# Patient Record
Sex: Male | Born: 2005
Health system: Southern US, Community
[De-identification: ages and names within clinical notes are randomized; demographics above are authoritative.]

## PROBLEM LIST (undated history)

## (undated) DIAGNOSIS — J020 Streptococcal pharyngitis: Secondary | ICD-10-CM

## (undated) DIAGNOSIS — L700 Acne vulgaris: Secondary | ICD-10-CM

## (undated) DIAGNOSIS — E559 Vitamin D deficiency, unspecified: Secondary | ICD-10-CM

## (undated) DIAGNOSIS — F419 Anxiety disorder, unspecified: Secondary | ICD-10-CM

## (undated) DIAGNOSIS — B2790 Infectious mononucleosis, unspecified without complication: Secondary | ICD-10-CM

## (undated) DIAGNOSIS — F32A Depression, unspecified: Secondary | ICD-10-CM

## (undated) DIAGNOSIS — F329 Major depressive disorder, single episode, unspecified: Secondary | ICD-10-CM

## (undated) DIAGNOSIS — S0093XS Contusion of unspecified part of head, sequela: Secondary | ICD-10-CM

## (undated) DIAGNOSIS — S93601S Unspecified sprain of right foot, sequela: Secondary | ICD-10-CM

## (undated) DIAGNOSIS — F909 Attention-deficit hyperactivity disorder, unspecified type: Secondary | ICD-10-CM

## (undated) DIAGNOSIS — S40012S Contusion of left shoulder, sequela: Secondary | ICD-10-CM

## (undated) DIAGNOSIS — S8392XA Sprain of unspecified site of left knee, initial encounter: Secondary | ICD-10-CM

## (undated) DIAGNOSIS — Z1589 Genetic susceptibility to other disease: Secondary | ICD-10-CM

## (undated) DIAGNOSIS — E7212 Methylenetetrahydrofolate reductase deficiency: Secondary | ICD-10-CM

## (undated) HISTORY — DX: Contusion of unspecified part of head, sequela: S00.93XS

## (undated) HISTORY — DX: Depression, unspecified: F32.A

## (undated) HISTORY — DX: Genetic susceptibility to other disease: Z15.89

## (undated) HISTORY — DX: Unspecified sprain of right foot, sequela: S93.601S

## (undated) HISTORY — PX: HAND SURGERY: SHX662

## (undated) HISTORY — DX: Sprain of unspecified site of left knee, initial encounter: S83.92XA

## (undated) HISTORY — DX: Contusion of left shoulder, sequela: S40.012S

## (undated) HISTORY — DX: Vitamin D deficiency, unspecified: E55.9

## (undated) HISTORY — DX: Anxiety disorder, unspecified: F41.9

## (undated) HISTORY — DX: Attention-deficit hyperactivity disorder, unspecified type: F90.9

## (undated) HISTORY — DX: Acne vulgaris: L70.0

---
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
## 2023-01-19 IMAGING — DX DG ANKLE PORT 2V*R*
3 series · 3 of 3 positions shown · non-contrast
Comparison: None.

CLINICAL DATA: Right ankle injury during wrestling practice

EXAM:
PORTABLE RIGHT ANKLE - 2 VIEW

[ankle ap]
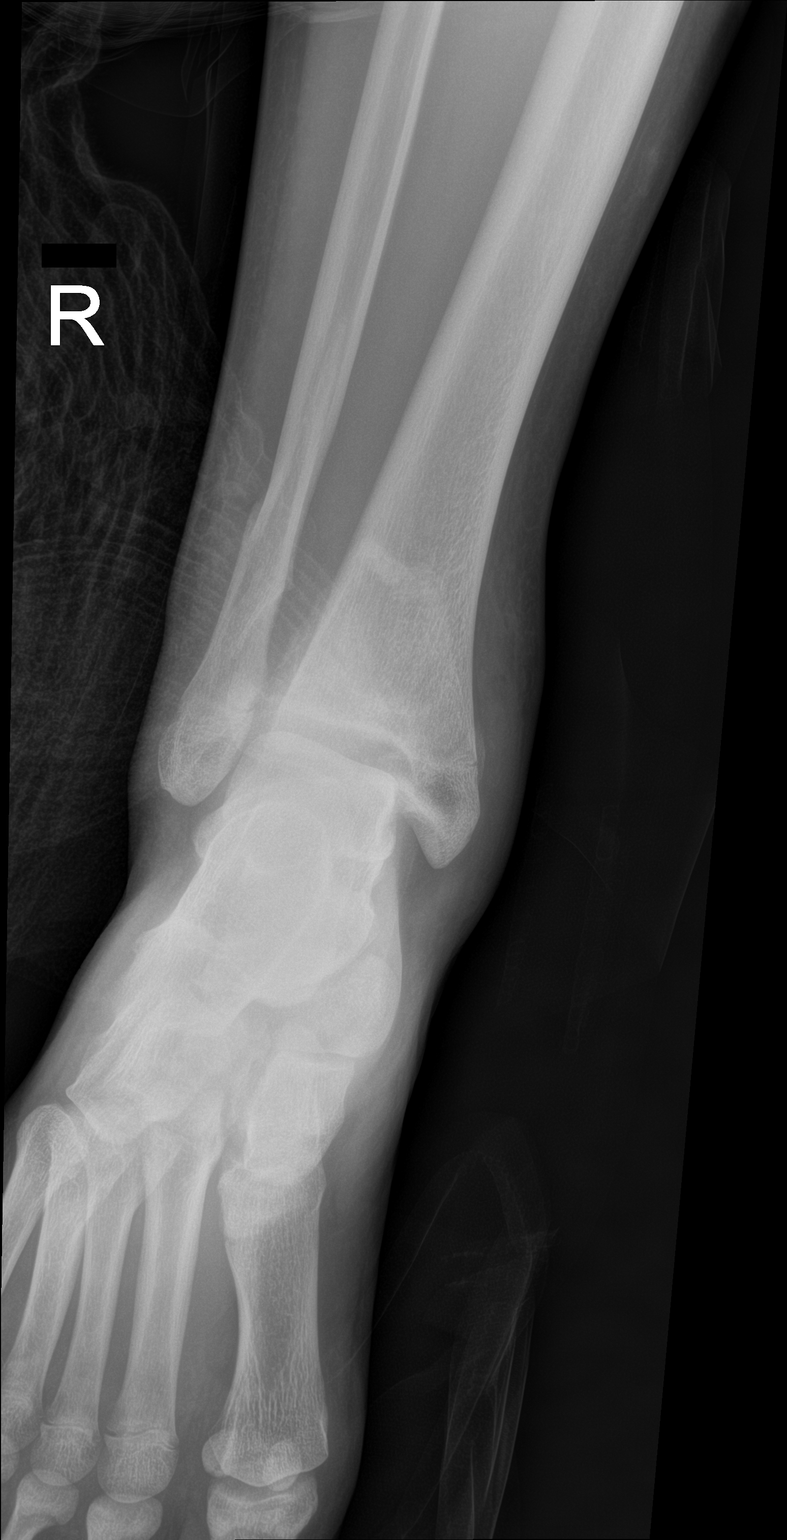

[ankle lat]
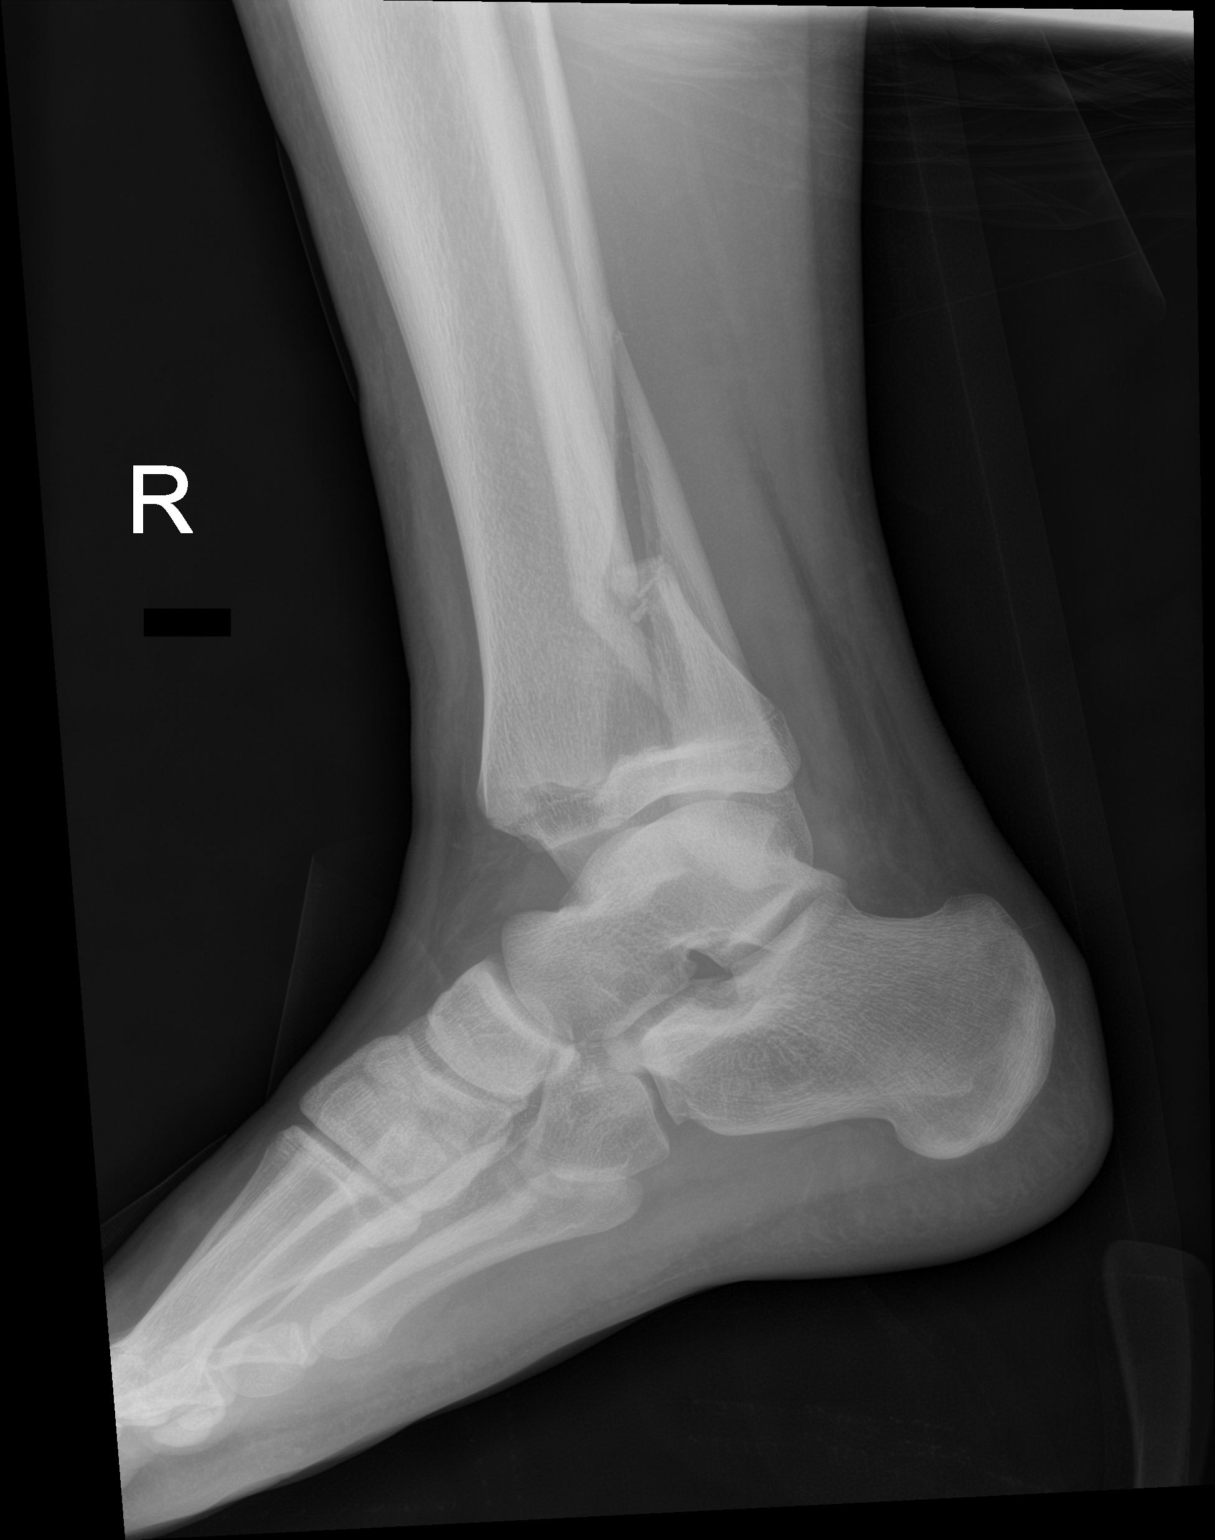

[ankle obl]
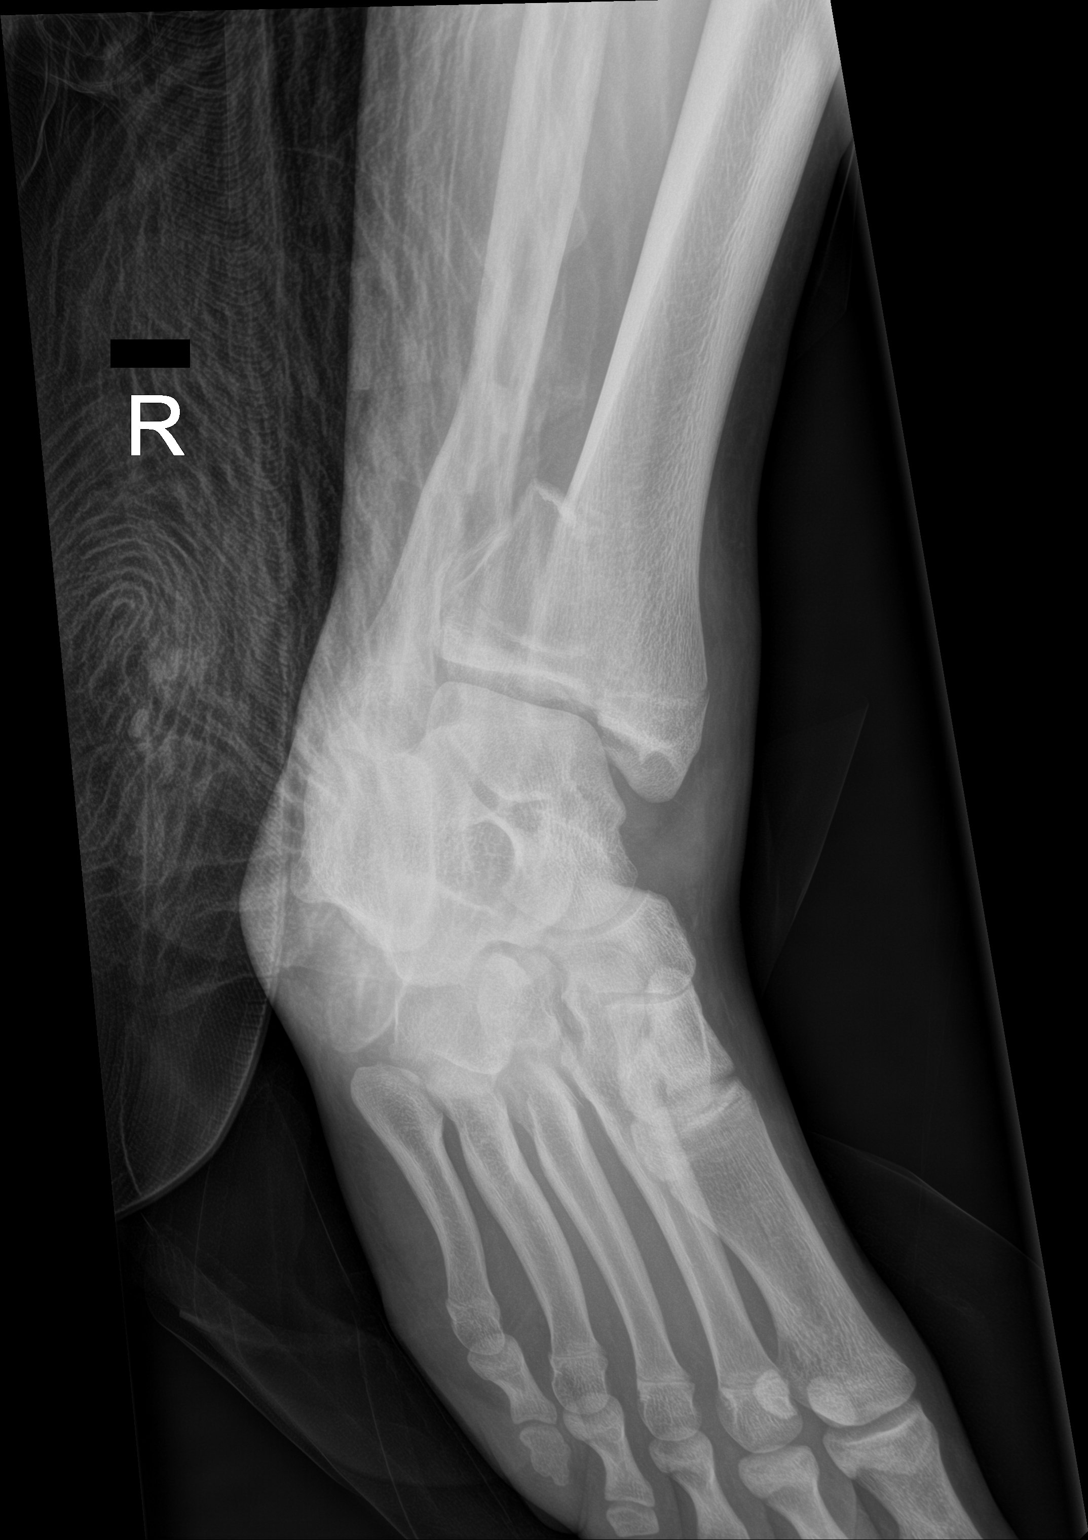

[3 of 3 positions shown; findings below may reference images not displayed]

FINDINGS: Oblique, transsyndesmotic fracture of the distal fibula. Minimally
displaced fracture of the medial malleolus and mildly comminuted,
displaced fracture of the posterior malleolus. Constellation of
findings compatible with a Weber B stage IV injury. Extensive
circumferential swelling of the ankle, most pronounced over the
medial malleolus. Associated ankle joint effusion. No other acute
fracture or traumatic osseous injury.
IMPRESSION: Trimalleolar fracture with a constellation of findings compatible
with a Weber B stage IV unstable ankle injury.

Circumferential swelling and ankle effusion.

## 2023-01-20 IMAGING — RF DG C-ARM 1-60 MIN
1 series · 5 of 5 positions shown · non-contrast
Comparison: 01/24/2021

CLINICAL DATA: ORIF

EXAM:
DG C-ARM 1-60 MIN
FLUOROSCOPY TIME:  Fluoroscopy Time:  42 seconds
Radiation Exposure Index (if provided by the fluoroscopic device):
1.63 mGy
Number of Acquired Spot Images: 5

[Series 1: run · 5 of 5 slices shown]
[im 1/5]
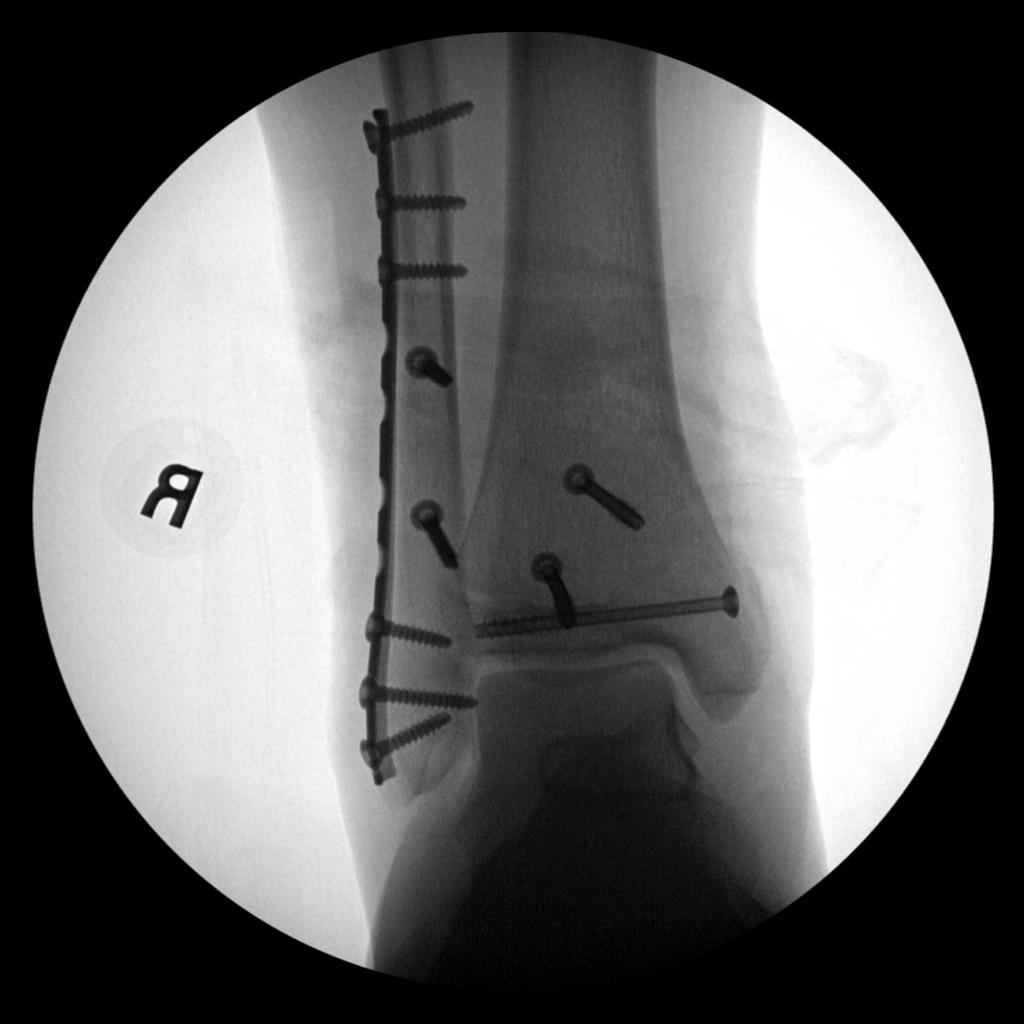
[im 2/5]
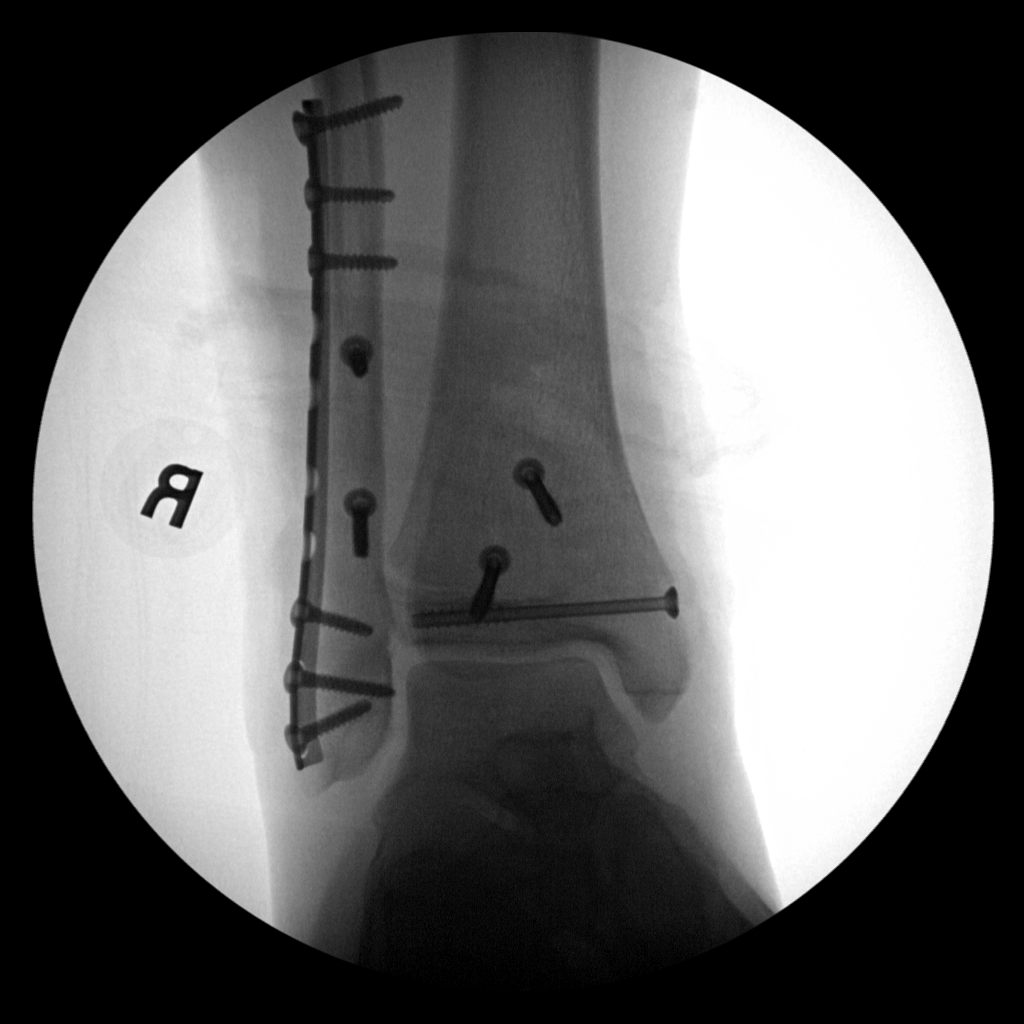
[im 3/5]
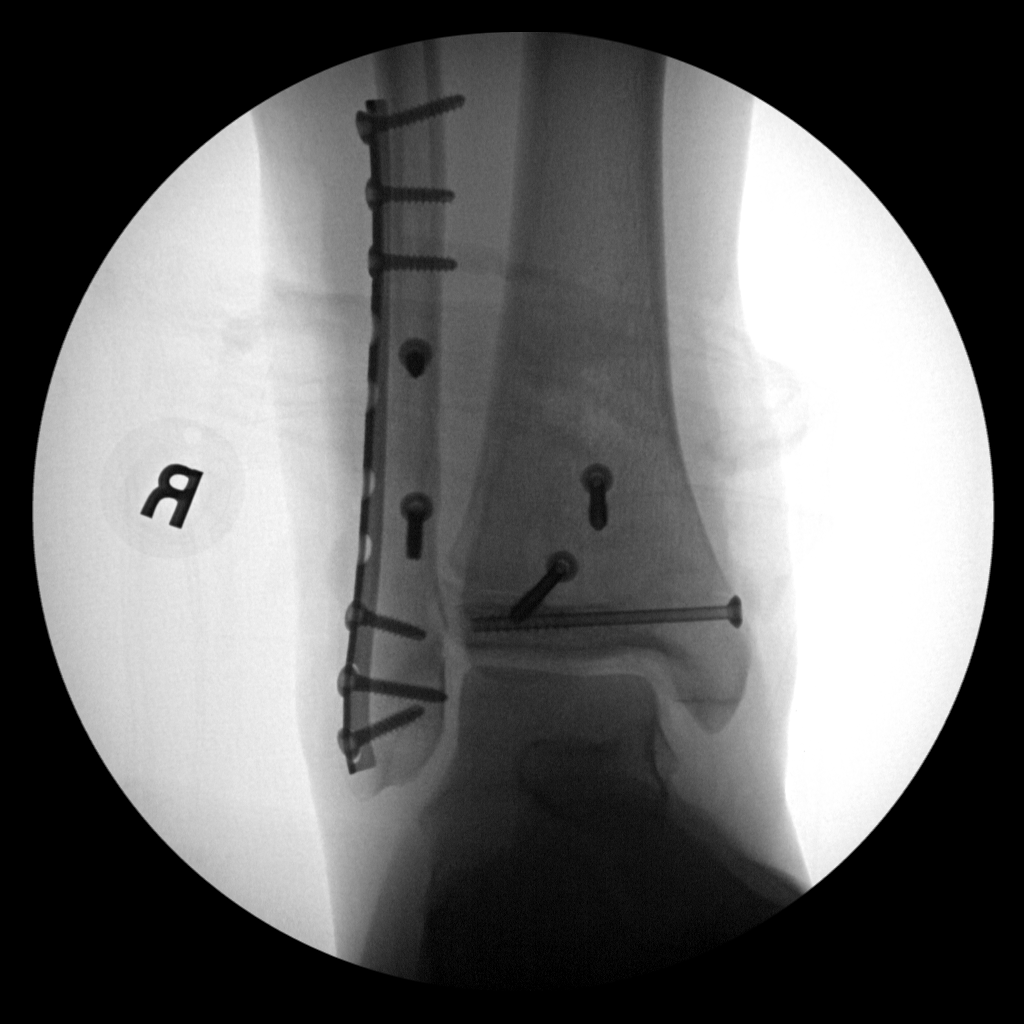
[im 4/5]
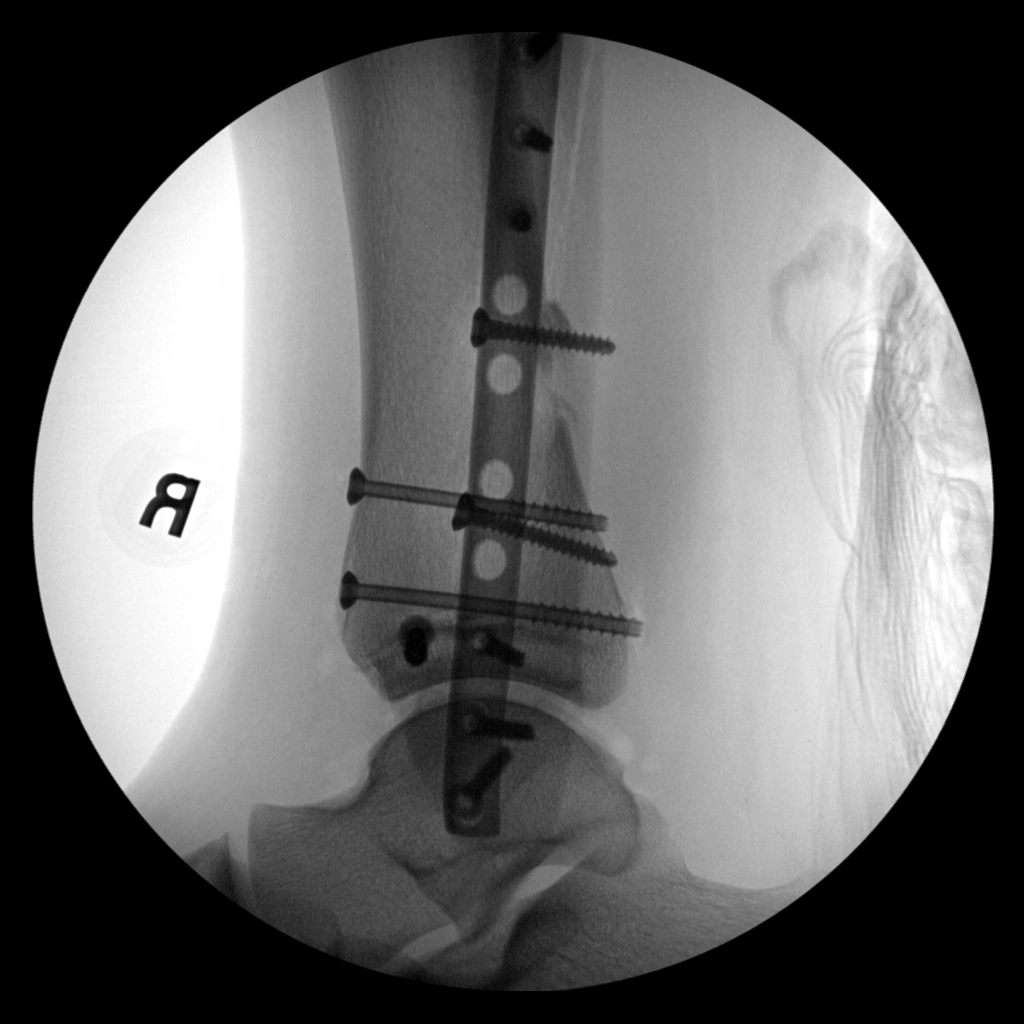
[im 5/5]
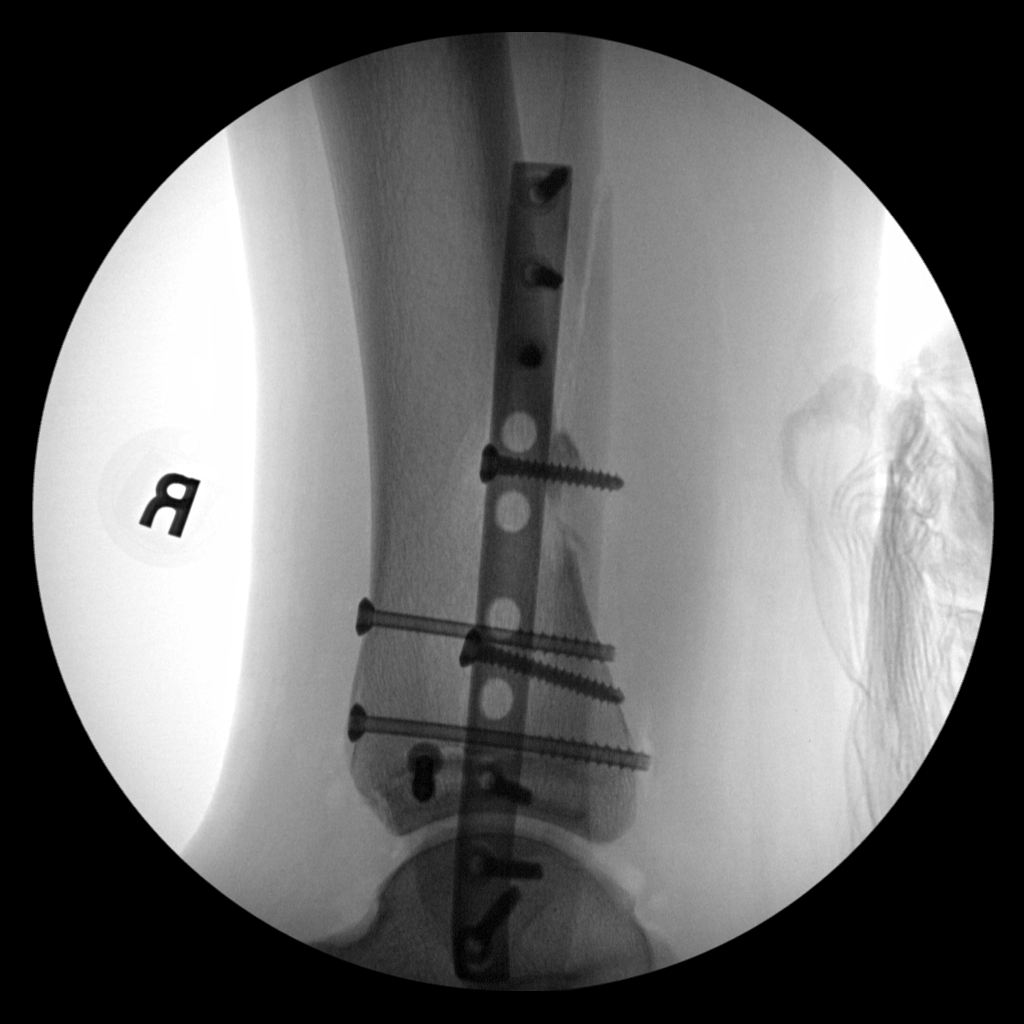

[5 of 5 positions shown; findings below may reference images not displayed]

FINDINGS: Patient has undergone plate screw fixation of the ankle. There are
expected postsurgical changes. The hardware is intact. The alignment
is improved.
IMPRESSION: Expected postsurgical changes of the ankle as above.

## 2023-02-07 ENCOUNTER — Emergency Department (HOSPITAL_COMMUNITY)
Admission: EM | Admit: 2023-02-07 | Discharge: 2023-02-07 | Disposition: A | Discharge status: Home / Self Care | Payer: BC Managed Care – PPO | Attending: Emergency Medicine | Admitting: Emergency Medicine

## 2023-02-07 ENCOUNTER — Other Ambulatory Visit: Payer: Self-pay

## 2023-02-07 DIAGNOSIS — T402X1A Poisoning by other opioids, accidental (unintentional), initial encounter: Secondary | ICD-10-CM | POA: Insufficient documentation

## 2023-02-07 DIAGNOSIS — T40601A Poisoning by unspecified narcotics, accidental (unintentional), initial encounter: Secondary | ICD-10-CM

## 2023-02-07 LAB — ETHANOL: Alcohol, Ethyl (B): <10 mg/dL (ref <10)

## 2023-02-07 LAB — CBG MONITORING, ED: Glucose-Capillary: 130 mg/dL — ABNORMAL HIGH (ref 70–99)

## 2023-02-07 LAB — CBC WITH DIFFERENTIAL/PLATELET
Abs Immature Granulocytes: 0.05 10*3/uL (ref 0.00–0.07)
Basophils Absolute: 0 10*3/uL (ref 0.0–0.1)
Basophils Relative: 0 %
Eosinophils Absolute: 0.1 10*3/uL (ref 0.0–0.5)
Eosinophils Relative: 1 %
HCT: 42.7 % (ref 39.0–52.0)
Hemoglobin: 14.7 g/dL (ref 13.0–17.0)
Immature Granulocytes: 0 %
Lymphocytes Relative: 8 %
Lymphs Abs: 1.2 10*3/uL (ref 0.7–4.0)
MCH: 30.4 pg (ref 26.0–34.0)
MCHC: 34.4 g/dL (ref 30.0–36.0)
MCV: 88.2 fL (ref 80.0–100.0)
Monocytes Absolute: 0.9 10*3/uL (ref 0.1–1.0)
Monocytes Relative: 6 %
Neutro Abs: 11.6 10*3/uL — ABNORMAL HIGH (ref 1.7–7.7)
Neutrophils Relative %: 85 %
Platelets: 243 10*3/uL (ref 150–400)
RBC: 4.84 MIL/uL (ref 4.22–5.81)
RDW: 11.9 % (ref 11.5–15.5)
WBC: 13.9 10*3/uL — ABNORMAL HIGH (ref 4.0–10.5)
nRBC: 0 % (ref 0.0–0.2)

## 2023-02-07 LAB — BASIC METABOLIC PANEL
Anion gap: 8 (ref 5–15)
BUN: 12 mg/dL (ref 6–20)
CO2: 26 mmol/L (ref 22–32)
Calcium: 8.7 mg/dL — ABNORMAL LOW (ref 8.9–10.3)
Chloride: 104 mmol/L (ref 98–111)
Creatinine, Ser: 1.25 mg/dL — ABNORMAL HIGH (ref 0.61–1.24)
GFR, Estimated: >60 mL/min (ref >60)
Glucose, Bld: 125 mg/dL — ABNORMAL HIGH (ref 70–99)
Potassium: 3.6 mmol/L (ref 3.5–5.1)
Sodium: 138 mmol/L (ref 135–145)

## 2023-02-07 LAB — RAPID URINE DRUG SCREEN, HOSP PERFORMED
Amphetamines: NOT DETECTED
Barbiturates: NOT DETECTED
Benzodiazepines: NOT DETECTED
Cocaine: NOT DETECTED
Opiates: NOT DETECTED
Tetrahydrocannabinol: POSITIVE — AB

## 2023-02-07 MED ORDER — ONDANSETRON HCL 4 MG/2ML IJ SOLN
4.0000 mg | Freq: Once | INTRAMUSCULAR | Status: AC
  Administered 2023-02-07: 4 mg via INTRAVENOUS
  Filled 2023-02-07: qty 2

## 2023-02-07 MED ORDER — LACTATED RINGERS IV SOLN: INTRAVENOUS | Status: DC

## 2023-02-07 MED ORDER — LACTATED RINGERS IV BOLUS
1000.0000 mL | Freq: Once | INTRAVENOUS | Status: AC
  Administered 2023-02-07: 1000 mL via INTRAVENOUS

## 2023-02-07 NOTE — ED Notes (Signed)
EDP Anthony [name] informed of Poison controls recommendations. Pt moved into assigned room.

## 2023-02-07 NOTE — ED Provider Notes (Signed)
[place] EMERGENCY DEPARTMENT AT [facility] Provider Note   CSN: [id number] Arrival date & time: 02/07/23  1946     History  Chief Complaint  Patient presents with   Drug Overdose    Anthony [name] is a 17 y.o. male.  17 year old male presents after unintentional overdose of Percocet just prior to arrival.  Patient states that he was not trying to harm himself.  States he took 1 Percocet that he bought from a friend.  Became dizzy and lightheaded.  Was found by his uncle cyanotic.  Department called he was given 2 rounds of Narcan with good response.  Denies any other coingestions at this time.  He has had some nausea and vomiting.  Otherwise feels back to his baseline.  His uncle states that patient has not had any acute psychiatric illnesses as of late.  He has not noted any depression.       Home Medications Prior to Admission medications   Medication Sig Start Date End Date Taking? Authorizing Provider  ARIPiprazole (ABILIFY) 5 MG tablet Take 1 tablet (5 mg total) by mouth at bedtime. Patient taking differently: Take 5 mg by mouth in the morning. 07/15/22   Ambrose Finland, MD  benzonatate (TESSALON) 100 MG capsule Take 1 capsule (100 mg total) by mouth every 8 (eight) hours. [id number]   Delora Fuel, MD  hydrOXYzine (ATARAX) 25 MG tablet Take 1 tablet (25 mg total) by mouth at bedtime as needed and may repeat dose one time if needed for anxiety. 07/15/22   Ambrose Finland, MD      Allergies    Clindamycin/lincomycin    Review of Systems   Review of Systems  All other systems reviewed and are negative.   Physical Exam Updated Vital Signs BP (!) 106/91 (BP Location: Left Arm)   Pulse 72   Temp 97.8 F (36.6 C) (Oral)   Resp 14   SpO2 100%  Physical Exam Vitals and nursing note reviewed.  Constitutional:      General: He is not in acute distress.    Appearance: Normal appearance. He is well-developed. He is not toxic-appearing.   HENT:     Head: Normocephalic and atraumatic.  Eyes:     General: Lids are normal.     Conjunctiva/sclera: Conjunctivae normal.     Pupils: Pupils are equal, round, and reactive to light.  Neck:     Thyroid: No thyroid mass.     Trachea: No tracheal deviation.  Cardiovascular:     Rate and Rhythm: Normal rate and regular rhythm.     Heart sounds: Normal heart sounds. No murmur heard.    No gallop.  Pulmonary:     Effort: Pulmonary effort is normal. No respiratory distress.     Breath sounds: Normal breath sounds. No stridor. No decreased breath sounds, wheezing, rhonchi or rales.  Abdominal:     General: There is no distension.     Palpations: Abdomen is soft.     Tenderness: There is no abdominal tenderness. There is no rebound.  Musculoskeletal:        General: No tenderness. Normal range of motion.     Cervical back: Normal range of motion and neck supple.  Skin:    General: Skin is warm and dry.     Findings: No abrasion or rash.  Neurological:     Mental Status: He is alert and oriented to person, place, and time. Mental status is at baseline.     GCS:  GCS eye subscore is 4. GCS verbal subscore is 5. GCS motor subscore is 6.     Cranial Nerves: No cranial nerve deficit.     Sensory: No sensory deficit.     Motor: Motor function is intact.  Psychiatric:        Attention and Perception: Attention normal.        Speech: Speech normal.        Behavior: Behavior normal.        Thought Content: Thought content does not include suicidal ideation. Thought content does not include suicidal plan.     ED Results / Procedures / Treatments   Labs (all labs ordered are listed, but only abnormal results are displayed) Labs Reviewed  CBG MONITORING, ED - Abnormal; Notable for the following components:      Result Value   Glucose-Capillary 130 (*)    All other components within normal limits  CBC WITH DIFFERENTIAL/PLATELET  RAPID URINE DRUG SCREEN, HOSP PERFORMED  ETHANOL   BASIC METABOLIC PANEL    EKG None  Radiology No results found.  Procedures Procedures    Medications Ordered in ED Medications  lactated ringers bolus 1,000 mL (has no administration in time range)  lactated ringers infusion (has no administration in time range)    ED Course/ Medical Decision Making/ A&P                             Medical Decision Making Amount and/or Complexity of Data Reviewed Labs: ordered.  Risk Prescription drug management.   Patient here after an accidentally ingestion of likely nail.  Received Narcan and was observed for 3 hours here and is back to his baseline.  No concern that this was a suicide attempt.  This was confirmed with his relative at the bedside.  Patient is alert and oriented x 4.  Will be discharged        Final Clinical Impression(s) / ED Diagnoses Final diagnoses:  None    Rx / DC Orders ED Discharge Orders     None         Lacretia Leigh, MD 02/07/23 2238

## 2023-02-07 NOTE — ED Triage Notes (Signed)
Pt presents to ED with Uncle. EMS called d/t overdose around 6 pm. Pt states taking 30 percocets around 5:30 pm. Pt was given Narcan on scene and refused hospital transport. Pt presents here for evaluation c/o headache, nausea, and vomiting. Denies CP/SOB. Pt alert and oriented. Ambulatory independently. Denies SI/HI.

## 2023-04-07 ENCOUNTER — Emergency Department (HOSPITAL_COMMUNITY): Payer: BC Managed Care – PPO

## 2023-04-07 ENCOUNTER — Encounter (HOSPITAL_COMMUNITY): Payer: Self-pay

## 2023-04-07 ENCOUNTER — Emergency Department (HOSPITAL_COMMUNITY)
Admission: EM | Admit: 2023-04-07 | Discharge: 2023-04-07 | Disposition: A | Discharge status: Home / Self Care | Payer: BC Managed Care – PPO | Attending: Emergency Medicine | Admitting: Emergency Medicine

## 2023-04-07 ENCOUNTER — Other Ambulatory Visit: Payer: Self-pay

## 2023-04-07 DIAGNOSIS — B2790 Infectious mononucleosis, unspecified without complication: Secondary | ICD-10-CM | POA: Diagnosis not present

## 2023-04-07 DIAGNOSIS — R112 Nausea with vomiting, unspecified: Secondary | ICD-10-CM | POA: Diagnosis present

## 2023-04-07 DIAGNOSIS — Z1152 Encounter for screening for COVID-19: Secondary | ICD-10-CM | POA: Diagnosis not present

## 2023-04-07 LAB — COMPREHENSIVE METABOLIC PANEL
ALT: 360 U/L — ABNORMAL HIGH (ref 0–44)
AST: 191 U/L — ABNORMAL HIGH (ref 15–41)
Albumin: 3.7 g/dL (ref 3.5–5.0)
Alkaline Phosphatase: 246 U/L — ABNORMAL HIGH (ref 38–126)
Anion gap: 9 (ref 5–15)
BUN: 10 mg/dL (ref 6–20)
CO2: 24 mmol/L (ref 22–32)
Calcium: 8.5 mg/dL — ABNORMAL LOW (ref 8.9–10.3)
Chloride: 102 mmol/L (ref 98–111)
Creatinine, Ser: 1 mg/dL (ref 0.61–1.24)
GFR, Estimated: >60 mL/min (ref >60)
Glucose, Bld: 89 mg/dL (ref 70–99)
Potassium: 3.6 mmol/L (ref 3.5–5.1)
Sodium: 135 mmol/L (ref 135–145)
Total Bilirubin: 1.8 mg/dL — ABNORMAL HIGH (ref 0.3–1.2)
Total Protein: 7.4 g/dL (ref 6.5–8.1)

## 2023-04-07 LAB — SARS CORONAVIRUS 2 BY RT PCR: SARS Coronavirus 2 by RT PCR: NEGATIVE

## 2023-04-07 LAB — CBC WITH DIFFERENTIAL/PLATELET
Abs Immature Granulocytes: 0.04 10*3/uL (ref 0.00–0.07)
Basophils Absolute: 0 10*3/uL (ref 0.0–0.1)
Basophils Relative: 1 %
Eosinophils Absolute: 0 10*3/uL (ref 0.0–0.5)
Eosinophils Relative: 0 %
HCT: 41.9 % (ref 39.0–52.0)
Hemoglobin: 14.6 g/dL (ref 13.0–17.0)
Immature Granulocytes: 1 %
Lymphocytes Relative: 72 %
Lymphs Abs: 5.5 10*3/uL — ABNORMAL HIGH (ref 0.7–4.0)
MCH: 30.3 pg (ref 26.0–34.0)
MCHC: 34.8 g/dL (ref 30.0–36.0)
MCV: 86.9 fL (ref 80.0–100.0)
Monocytes Absolute: 0.4 10*3/uL (ref 0.1–1.0)
Monocytes Relative: 6 %
Neutro Abs: 1.5 10*3/uL — ABNORMAL LOW (ref 1.7–7.7)
Neutrophils Relative %: 20 %
Platelets: 113 10*3/uL — ABNORMAL LOW (ref 150–400)
RBC: 4.82 MIL/uL (ref 4.22–5.81)
RDW: 12.1 % (ref 11.5–15.5)
WBC: 7.5 10*3/uL (ref 4.0–10.5)
nRBC: 0 % (ref 0.0–0.2)

## 2023-04-07 LAB — MONONUCLEOSIS SCREEN: Mono Screen: POSITIVE — AB

## 2023-04-07 LAB — HEPATITIS PANEL, ACUTE
HCV Ab: NONREACTIVE
Hep A IgM: NONREACTIVE
Hep B C IgM: NONREACTIVE
Hepatitis B Surface Ag: NONREACTIVE

## 2023-04-07 LAB — LIPASE, BLOOD: Lipase: 38 U/L (ref 11–51)

## 2023-04-07 MED ORDER — IOHEXOL 300 MG/ML  SOLN
100.0000 mL | Freq: Once | INTRAMUSCULAR | Status: AC | PRN
  Administered 2023-04-07: 100 mL via INTRAVENOUS

## 2023-04-07 MED ORDER — ONDANSETRON HCL 4 MG/2ML IJ SOLN
4.0000 mg | Freq: Once | INTRAMUSCULAR | Status: AC
  Administered 2023-04-07: 4 mg via INTRAVENOUS
  Filled 2023-04-07: qty 2

## 2023-04-07 MED ORDER — LACTATED RINGERS IV BOLUS
1000.0000 mL | Freq: Once | INTRAVENOUS | Status: AC
  Administered 2023-04-07: 1000 mL via INTRAVENOUS

## 2023-04-07 MED ORDER — FENTANYL CITRATE PF 50 MCG/ML IJ SOSY
50.0000 ug | PREFILLED_SYRINGE | Freq: Once | INTRAMUSCULAR | Status: AC
  Administered 2023-04-07: 50 ug via INTRAVENOUS
  Filled 2023-04-07: qty 1

## 2023-04-07 NOTE — ED Provider Notes (Signed)
EMERGENCY DEPARTMENT AT [facility] Provider Note   CSN: [id number] Arrival date & time: 04/07/23  4098     History  Chief Complaint  Patient presents with   Nausea    Anthony [name] is a 17 y.o. male.  HPI   17 year old male with history significant for depression, ADHD, anxiety, presenting to the emergency department with nausea, NBNB vomiting, abdominal pain for the past week.  The patient states that he also endorses a headache.  He denies any sick contacts.  He states that he has had periumbilical abdominal pain with some radiation to the right lower quadrant over the past week.  He was unable to keep anything down stating "I was only able to eat about 3 chicken nuggets yesterday."  He endorses persistent nausea, anorexia and chills. His last BM was yesterday and was normal. He had difficulty sleeping last night due to worsening symptoms and thus presents to the emergency department today for further evaluation.    Home Medications Prior to Admission medications   Medication Sig Start Date End Date Taking? Authorizing Provider  ibuprofen (ADVIL) 400 MG tablet Take 400 mg by mouth in the morning and at bedtime.   Yes [provider]  ARIPiprazole (ABILIFY) 5 MG tablet Take 1 tablet (5 mg total) by mouth at bedtime. Patient taking differently: Take 5 mg by mouth in the morning. 07/15/22   Leata Mouse, MD  benzonatate (TESSALON) 100 MG capsule Take 1 capsule (100 mg total) by mouth every 8 (eight) hours. Patient not taking: Reported on 04/07/2023 09/26/22   Dione Booze, MD  hydrOXYzine (ATARAX) 25 MG tablet Take 1 tablet (25 mg total) by mouth at bedtime as needed and may repeat dose one time if needed for anxiety. Patient not taking: Reported on 04/07/2023 07/15/22   Leata Mouse, MD      Allergies    Clindamycin/lincomycin    Review of Systems   Review of Systems  All other systems reviewed and are  negative.   Physical Exam Updated Vital Signs BP 122/69 (BP Location: Right Arm)   Pulse 70   Temp 98.7 F (37.1 C) (Oral)   Resp 18   Ht 6\' 1"  (1.854 m)   Wt 99.8 kg   SpO2 97%   BMI 29.03 kg/m  Physical Exam Vitals and nursing note reviewed.  Constitutional:      General: He is not in acute distress.    Appearance: He is well-developed.  HENT:     Head: Normocephalic and atraumatic.  Eyes:     Conjunctiva/sclera: Conjunctivae normal.  Cardiovascular:     Rate and Rhythm: Normal rate and regular rhythm.  Pulmonary:     Effort: Pulmonary effort is normal. No respiratory distress.     Breath sounds: Normal breath sounds.  Abdominal:     Palpations: Abdomen is soft.     Tenderness: There is abdominal tenderness in the right lower quadrant and periumbilical area. There is no guarding or rebound.  Musculoskeletal:        General: No swelling.     Cervical back: Neck supple.  Skin:    General: Skin is warm and dry.     Capillary Refill: Capillary refill takes less than 2 seconds.  Neurological:     Mental Status: He is alert.  Psychiatric:        Mood and Affect: Mood normal.     ED Results / Procedures / Treatments   Labs (all labs ordered are  listed, but only abnormal results are displayed) Labs Reviewed  CBC WITH DIFFERENTIAL/PLATELET - Abnormal; Notable for the following components:      Result Value   Platelets 113 (*)    Neutro Abs 1.5 (*)    Lymphs Abs 5.5 (*)    All other components within normal limits  COMPREHENSIVE METABOLIC PANEL - Abnormal; Notable for the following components:   Calcium 8.5 (*)    AST 191 (*)    ALT 360 (*)    Alkaline Phosphatase 246 (*)    Total Bilirubin 1.8 (*)    All other components within normal limits  MONONUCLEOSIS SCREEN - Abnormal; Notable for the following components:   Mono Screen POSITIVE (*)    All other components within normal limits  EPSTEIN-BARR VIRUS VCA, IGM - Abnormal; Notable for the following  components:   EBV VCA IgM >160.0 (*)    All other components within normal limits  SARS CORONAVIRUS 2 BY RT PCR  LIPASE, BLOOD  HEPATITIS PANEL, ACUTE  HIV-1 RNA QUANT-NO REFLEX-BLD  HCV RNA DIAGNOSIS, NAA    EKG None  Radiology US Abdomen Limited RUQ (LIVER/GB)  Result Date: 04/07/2023 CLINICAL DATA:  17 year old male with abnormal LFTs. EXAM: ULTRASOUND ABDOMEN LIMITED RIGHT UPPER QUADRANT COMPARISON:  CT Abdomen and Pelvis 0921 hours today. FINDINGS: Gallbladder: Partially contracted. No gallstones or sludge within the lumen. Wall thickness within normal limits. No sonographic Murphy sign elicited. Common bile duct: Diameter: 4 mm, normal. Liver: No focal lesion identified. Within normal limits in parenchymal echogenicity. Portal vein is patent on color Doppler imaging with normal direction of blood flow towards the liver. Other: Negative visible right kidney. IMPRESSION: Normal right upper quadrant ultrasound. Electronically Signed   By: Odessa Fleming M.D.   On: 04/07/2023 10:29   CT ABDOMEN PELVIS W CONTRAST  Result Date: 04/07/2023 CLINICAL DATA:  Right lower quadrant abdominal pain, headache EXAM: CT ABDOMEN AND PELVIS WITH CONTRAST TECHNIQUE: Multidetector CT imaging of the abdomen and pelvis was performed using the standard protocol following bolus administration of intravenous contrast. RADIATION DOSE REDUCTION: This exam was performed according to the departmental dose-optimization program which includes automated exposure control, adjustment of the mA and/or kV according to patient size and/or use of iterative reconstruction technique. CONTRAST:  OMNIPAQUE IOHEXOL 300 MG/ML  SOLN COMPARISON:  07/23/2022 FINDINGS: Lower chest: Minimal bibasilar atelectasis. Hepatobiliary: Liver is within normal limits. Gallbladder is unremarkable. No intrahepatic or extrahepatic duct dilatation. Pancreas: Within normal limits. Spleen: Within normal limits. Adrenals/Urinary Tract: Adrenal glands are  within normal limits. Kidneys are within normal limits.  No hydronephrosis. Bladder is within normal limits. Stomach/Bowel: Stomach is within normal limits. No evidence of bowel obstruction. Normal appendix (series 2/image 68). No colonic wall thickening or inflammatory changes. Vascular/Lymphatic: No evidence of abdominal aortic aneurysm. No suspicious abdominopelvic lymphadenopathy. Reproductive: Prostate is unremarkable. Other: No abdominopelvic ascites. Musculoskeletal: Visualized osseous structures are within normal limits. IMPRESSION: Negative CT abdomen/pelvis. Normal appendix. Electronically Signed   By: Charline Bills M.D.   On: 04/07/2023 09:36    Procedures Procedures    Medications Ordered in ED Medications  lactated ringers bolus 1,000 mL (0 mLs Intravenous Stopped 04/07/23 0957)  ondansetron (ZOFRAN) injection 4 mg (4 mg Intravenous Given 04/07/23 0807)  fentaNYL (SUBLIMAZE) injection 50 mcg (50 mcg Intravenous Given 04/07/23 0807)  iohexol (OMNIPAQUE) 300 MG/ML solution 100 mL (100 mLs Intravenous Contrast Given 04/07/23 0102)    ED Course/ Medical Decision Making/ A&P Clinical Course as of 04/08/23 2106  Mon Apr 07, 2023  1002 AST(!): 191 [JL]  1002 ALT(!): 360 [JL]  1002 Alkaline Phosphatase(!): 246 [JL]  1002 Total Bilirubin(!): 1.8 [JL]  1148 Mono Screen(!): POSITIVE [JL]    Clinical Course User Index [JL] Ernie Avena, MD                             Medical Decision Making Amount and/or Complexity of Data Reviewed Labs: ordered. Decision-making details documented in ED Course. Radiology: ordered.  Risk Prescription drug management.    17 year old male with history significant for depression, ADHD, anxiety, presenting to the emergency department with nausea, NBNB vomiting, abdominal pain for the past week.  The patient states that he also endorses a headache.  He denies any sick contacts.  He states that he has had periumbilical abdominal pain with some  radiation to the right lower quadrant over the past week.  He was unable to keep anything down stating "I was only able to eat about 3 chicken nuggets yesterday."  He endorses persistent nausea, anorexia and chills. His last BM was yesterday and was normal. He had difficulty sleeping last night due to worsening symptoms and thus presents to the emergency department today for further evaluation.    On arrival, the patient was vitally stable, afebrile, temperature 99.9, not tachycardic heart rate 86, BP 138/74, saturating 98% on room air.  Patient presenting with nausea, NBNB emesis, abdominal pain for the past week.  He has had a headache.  No sore throat.  No sick contacts.  Decreased oral intake.  Passing gas and moving his bowels.  On exam, the patient had generalized abdominal tenderness and some tenderness in the right lower quadrant.  Differential diagnosis includes appendicitis, viral infection, less likely cholelithiasis/cholecystitis, pancreatitis, constipation.  Laboratory evaluation significant for CBC without a leukocytosis or anemia, CMP with elevated LFTs with an AST of 9191, ALT of 360, T. bili elevated to 1.8.  The patient was administered an LR bolus, IV fentanyl and IV Zofran for pain control.  His lipase was normal his COVID-19 testing was negative.  Hepatitis panel was collected and resulted negative.  HIV RNA was negative, HCV was also negative.  CT abdomen pelvis was performed revealed the following: No acute abnormalities  Given the patient's elevated LFTs, right upper quadrant ultrasound was performed: Negative  Given the patient's abdominal discomfort, viral syndrome, elevated LFTs, suspect potential mononucleosis.  Monoscreen was positive and Epstein-Barr virus IgM also resulted elevated.  This is consistent with a diagnosis of mononucleosis.  The patient was advised supportive care at home, continue oral rehydration, avoidance of contact sports activities for at least the next  month, follow-up outpatient with a PCP.  Overall tolerating oral intake, symptomatically improved, stable for discharge.  DC Instructions: CT imaging and ultrasound of the liver was reassuring.  You have elevated liver enzymes in the setting of testing positive for infectious mononucleosis.  Mononucleosis can last for up to 4 weeks. Avoid contact sports for 1 month due to the risk of splenic rupture. Recommend supportive care with continued hydration, recommend Motrin for pain control.   Final Clinical Impression(s) / ED Diagnoses Final diagnoses:  Infectious mononucleosis without complication, infectious mononucleosis due to unspecified organism    Rx / DC Orders ED Discharge Orders     None         Ernie Avena, MD 04/08/23 2106

## 2023-04-07 NOTE — ED Triage Notes (Signed)
Pt. Arrive c/o Nausea, vomiting, and abdominal pain x1 week. Patient also states that he has had a headache. He states that he hasn't been around anyone who has been.

## 2023-04-07 NOTE — Discharge Instructions (Addendum)
CT imaging and ultrasound of the liver was reassuring.  You have elevated liver enzymes in the setting of testing positive for infectious mononucleosis.  Mononucleosis can last for up to 4 weeks. Avoid contact sports for 1 month due to the risk of splenic rupture. Recommend supportive care with continued hydration, recommend Motrin for pain control.

## 2023-04-08 LAB — HCV RNA DIAGNOSIS, NAA: HCV RNA, Quantitation: NOT DETECTED IU/mL

## 2023-04-08 LAB — HIV-1 RNA QUANT-NO REFLEX-BLD
HIV 1 RNA Quant: <20 copies/mL
LOG10 HIV-1 RNA: UNDETERMINED log10copy/mL

## 2023-04-08 LAB — EPSTEIN-BARR VIRUS VCA, IGM: EBV VCA IgM: >160 U/mL — ABNORMAL HIGH (ref 0.0–35.9)

## 2023-04-14 ENCOUNTER — Encounter (HOSPITAL_COMMUNITY): Payer: Self-pay

## 2023-04-14 ENCOUNTER — Other Ambulatory Visit: Payer: Self-pay

## 2023-04-14 ENCOUNTER — Emergency Department (HOSPITAL_COMMUNITY)
Admission: EM | Admit: 2023-04-14 | Discharge: 2023-04-14 | Disposition: A | Discharge status: Home / Self Care | Payer: BC Managed Care – PPO | Attending: Emergency Medicine | Admitting: Emergency Medicine

## 2023-04-14 DIAGNOSIS — B2700 Gammaherpesviral mononucleosis without complication: Secondary | ICD-10-CM | POA: Insufficient documentation

## 2023-04-14 DIAGNOSIS — J029 Acute pharyngitis, unspecified: Secondary | ICD-10-CM | POA: Diagnosis present

## 2023-04-14 HISTORY — DX: Infectious mononucleosis, unspecified without complication: B27.90

## 2023-04-14 MED ORDER — KETOROLAC TROMETHAMINE 15 MG/ML IJ SOLN
15.0000 mg | Freq: Once | INTRAMUSCULAR | Status: AC
  Administered 2023-04-14: 15 mg via INTRAMUSCULAR
  Filled 2023-04-14: qty 1

## 2023-04-14 MED ORDER — DEXAMETHASONE 4 MG PO TABS
10.0000 mg | ORAL_TABLET | Freq: Once | ORAL | Status: AC
  Administered 2023-04-14: 10 mg via ORAL
  Filled 2023-04-14: qty 1

## 2023-04-14 NOTE — ED Triage Notes (Signed)
Pt arrives c/o ongoing sore throat, muffled voice, L ear pain and decreased PO intake due to pain with swallowing. Pt also states that he also thinks he passed out while in the tub. He states that he put his phone down and 2 hours had passed without him remembering, but states that the bath water was still warm when he woke up. Denies falling or injury. Took dayquil and tylenol yesterday afternoon without much relief. Diagnosed with mono last week.

## 2023-04-14 NOTE — ED Provider Notes (Signed)
[place] EMERGENCY DEPARTMENT AT [facility] Provider Note   CSN: [id number] Arrival date & time: 04/14/23  0221     History  Chief Complaint  Patient presents with   Sore Throat    Anthony [name] is a 17 y.o. male.  17 yo M with a chief complaints of a sore throat.  This has been going on for the better part of the week.  He was seen here at the onset of his symptoms and was diagnosed with mononucleosis.  He feels like he is not really able to eat and drink due to the throat pain.  Feels like it has been getting worse.  Feeling fatigued as well.   Sore Throat       Home Medications Prior to Admission medications   Medication Sig Start Date End Date Taking? Authorizing Provider  ARIPiprazole (ABILIFY) 5 MG tablet Take 1 tablet (5 mg total) by mouth at bedtime. Patient taking differently: Take 5 mg by mouth in the morning. 07/15/22   Leata Mouse, MD  benzonatate (TESSALON) 100 MG capsule Take 1 capsule (100 mg total) by mouth every 8 (eight) hours. Patient not taking: Reported on 04/07/2023 09/26/22   Dione Booze, MD  hydrOXYzine (ATARAX) 25 MG tablet Take 1 tablet (25 mg total) by mouth at bedtime as needed and may repeat dose one time if needed for anxiety. Patient not taking: Reported on 04/07/2023 07/15/22   Leata Mouse, MD  ibuprofen (ADVIL) 400 MG tablet Take 400 mg by mouth in the morning and at bedtime.    [provider]      Allergies    Clindamycin/lincomycin    Review of Systems   Review of Systems  Physical Exam Updated Vital Signs BP (!) 147/75 (BP Location: Right Arm)   Pulse (!) 103   Temp 99.6 F (37.6 C) (Oral)   Resp 20   Ht 6\' 1"  (1.854 m)   Wt 99.8 kg   SpO2 96%   BMI 29.03 kg/m  Physical Exam Vitals and nursing note reviewed.  Constitutional:      Appearance: He is well-developed.  HENT:     Head: Normocephalic and atraumatic.     Mouth/Throat:     Tonsils: 3+ on the right. 3+ on the  left.     Comments: Bilateral tonsillar swelling with exudates.  Tolerating secretions without difficulty.  Able to rotate his head without pain.  No sublingual swelling. Eyes:     Pupils: Pupils are equal, round, and reactive to light.  Neck:     Vascular: No JVD.  Cardiovascular:     Rate and Rhythm: Normal rate and regular rhythm.     Heart sounds: No murmur heard.    No friction rub. No gallop.  Pulmonary:     Effort: No respiratory distress.     Breath sounds: No wheezing.  Abdominal:     General: There is no distension.     Tenderness: There is no abdominal tenderness. There is no guarding or rebound.  Musculoskeletal:        General: Normal range of motion.     Cervical back: Normal range of motion and neck supple.  Skin:    Coloration: Skin is not pale.     Findings: No rash.  Neurological:     Mental Status: He is alert and oriented to person, place, and time.  Psychiatric:        Behavior: Behavior normal.     ED Results /  Procedures / Treatments   Labs (all labs ordered are listed, but only abnormal results are displayed) Labs Reviewed - No data to display  EKG None  Radiology No results found.  Procedures Procedures    Medications Ordered in ED Medications  dexamethasone (DECADRON) tablet 10 mg (has no administration in time range)  ketorolac (TORADOL) 15 MG/ML injection 15 mg (has no administration in time range)    ED Course/ Medical Decision Making/ A&P                             Medical Decision Making Risk Prescription drug management.   17 yo M with a chief complaints of a sore throat.  The patient was seen recently and diagnosed with mononucleosis.  At that visit he had multiple lab panels as well as CT imaging and an ultrasound.  Patient is back because his sore throat is so bad that he does not want to swallow.  He can swallow and swallows on exam without issue.  He is able to rotate his head without issue as well.  I doubt  peritonsillar abscess or retropharyngeal abscess or epiglottitis.  He is able to phonate without issue.  He is well-appearing and nontoxic.  He apparently fell asleep while he was taking a bath earlier today but is worried that maybe he passed out.  He is mildly tachycardic on arrival which could be due to his fever.  I do not feel he benefit from laboratory evaluation.  Encouraged him to continue to eat and drink as well as he can.  Will give a dose of Decadron here to try and help him with his throat discomfort.  PCP follow-up.  2:56 AM:  I have discussed the diagnosis/risks/treatment options with the patient.  Evaluation and diagnostic testing in the emergency department does not suggest an emergent condition requiring admission or immediate intervention beyond what has been performed at this time.  They will follow up with PCP. We also discussed returning to the ED immediately if new or worsening sx occur. We discussed the sx which are most concerning (e.g., sudden worsening pain, fever, inability to tolerate by mouth) that necessitate immediate return. Medications administered to the patient during their visit and any new prescriptions provided to the patient are listed below.  Medications given during this visit Medications  dexamethasone (DECADRON) tablet 10 mg (has no administration in time range)  ketorolac (TORADOL) 15 MG/ML injection 15 mg (has no administration in time range)     The patient appears reasonably screen and/or stabilized for discharge and I doubt any other medical condition or other [facility] requiring further screening, evaluation, or treatment in the ED at this time prior to discharge.          Final Clinical Impression(s) / ED Diagnoses Final diagnoses:  Gammaherpesviral mononucleosis without complication    Rx / DC Orders ED Discharge Orders     None         Melene Plan, DO 04/14/23 5625

## 2023-04-14 NOTE — Discharge Instructions (Signed)
Take 4 over the counter ibuprofen tablets 3 times a day or 2 over-the-counter naproxen tablets twice a day for pain. Also take tylenol 1000mg (2 extra strength) four times a day.    You have to eat and drink.  That helps lubricate your throat.  If you stop it actually makes things way worse.  If you feel like you are unable then you need to freeze Gatorade and chip it up like a slushy.

## 2023-04-15 ENCOUNTER — Other Ambulatory Visit: Payer: Self-pay

## 2023-04-15 ENCOUNTER — Emergency Department (HOSPITAL_BASED_OUTPATIENT_CLINIC_OR_DEPARTMENT_OTHER)
Admission: EM | Admit: 2023-04-15 | Discharge: 2023-04-15 | Disposition: A | Discharge status: Home / Self Care | Payer: BC Managed Care – PPO | Attending: Emergency Medicine | Admitting: Emergency Medicine

## 2023-04-15 ENCOUNTER — Emergency Department (HOSPITAL_BASED_OUTPATIENT_CLINIC_OR_DEPARTMENT_OTHER): Payer: BC Managed Care – PPO

## 2023-04-15 ENCOUNTER — Other Ambulatory Visit (HOSPITAL_BASED_OUTPATIENT_CLINIC_OR_DEPARTMENT_OTHER): Payer: Self-pay

## 2023-04-15 ENCOUNTER — Encounter (HOSPITAL_BASED_OUTPATIENT_CLINIC_OR_DEPARTMENT_OTHER): Payer: Self-pay | Admitting: Emergency Medicine

## 2023-04-15 DIAGNOSIS — J029 Acute pharyngitis, unspecified: Secondary | ICD-10-CM | POA: Diagnosis present

## 2023-04-15 DIAGNOSIS — J020 Streptococcal pharyngitis: Secondary | ICD-10-CM | POA: Insufficient documentation

## 2023-04-15 DIAGNOSIS — B2799 Infectious mononucleosis, unspecified with other complication: Secondary | ICD-10-CM | POA: Diagnosis not present

## 2023-04-15 DIAGNOSIS — R1012 Left upper quadrant pain: Secondary | ICD-10-CM | POA: Diagnosis not present

## 2023-04-15 LAB — CBC
HCT: 38.5 % — ABNORMAL LOW (ref 39.0–52.0)
Hemoglobin: 13.2 g/dL (ref 13.0–17.0)
MCH: 30 pg (ref 26.0–34.0)
MCHC: 34.3 g/dL (ref 30.0–36.0)
MCV: 87.5 fL (ref 80.0–100.0)
Platelets: 210 10*3/uL (ref 150–400)
RBC: 4.4 MIL/uL (ref 4.22–5.81)
RDW: 13.1 % (ref 11.5–15.5)
WBC: 9.3 10*3/uL (ref 4.0–10.5)
nRBC: 0 % (ref 0.0–0.2)

## 2023-04-15 LAB — COMPREHENSIVE METABOLIC PANEL
ALT: 262 U/L — ABNORMAL HIGH (ref 0–44)
AST: 118 U/L — ABNORMAL HIGH (ref 15–41)
Albumin: 3.8 g/dL (ref 3.5–5.0)
Alkaline Phosphatase: 216 U/L — ABNORMAL HIGH (ref 38–126)
Anion gap: 8 (ref 5–15)
BUN: 10 mg/dL (ref 6–20)
CO2: 25 mmol/L (ref 22–32)
Calcium: 8.6 mg/dL — ABNORMAL LOW (ref 8.9–10.3)
Chloride: 103 mmol/L (ref 98–111)
Creatinine, Ser: 0.93 mg/dL (ref 0.61–1.24)
GFR, Estimated: >60 mL/min (ref >60)
Glucose, Bld: 93 mg/dL (ref 70–99)
Potassium: 4.2 mmol/L (ref 3.5–5.1)
Sodium: 136 mmol/L (ref 135–145)
Total Bilirubin: 0.7 mg/dL (ref 0.3–1.2)
Total Protein: 7.3 g/dL (ref 6.5–8.1)

## 2023-04-15 LAB — GROUP A STREP BY PCR: Group A Strep by PCR: DETECTED — AB

## 2023-04-15 MED ORDER — SODIUM CHLORIDE 0.9 % IV BOLUS (SEPSIS)
1000.0000 mL | Freq: Once | INTRAVENOUS | Status: AC
  Administered 2023-04-15: 1000 mL via INTRAVENOUS

## 2023-04-15 MED ORDER — KETOROLAC TROMETHAMINE 30 MG/ML IJ SOLN
30.0000 mg | Freq: Once | INTRAMUSCULAR | Status: AC
  Administered 2023-04-15: 30 mg via INTRAVENOUS
  Filled 2023-04-15: qty 1

## 2023-04-15 MED ORDER — SODIUM CHLORIDE 0.9 % IV SOLN: 1000.0000 mL | INTRAVENOUS | Status: DC

## 2023-04-15 MED ORDER — DEXAMETHASONE SODIUM PHOSPHATE 10 MG/ML IJ SOLN
10.0000 mg | Freq: Once | INTRAMUSCULAR | Status: AC
  Administered 2023-04-15: 10 mg via INTRAVENOUS
  Filled 2023-04-15: qty 1

## 2023-04-15 MED ORDER — PENICILLIN G BENZATHINE 1200000 UNIT/2ML IM SUSY
1.2000 10*6.[IU] | PREFILLED_SYRINGE | Freq: Once | INTRAMUSCULAR | Status: AC
  Administered 2023-04-15: 1.2 10*6.[IU] via INTRAMUSCULAR
  Filled 2023-04-15: qty 2

## 2023-04-15 NOTE — ED Provider Notes (Signed)
[place] EMERGENCY DEPARTMENT AT [facility] Provider Note   CSN: [id number] Arrival date & time: 04/15/23  1125     History  Chief Complaint  Patient presents with   Sore Throat    Anthony [name] is a 17 y.o. male.   Sore Throat     Patient has a history of ADHD, anxiety depression who presents to the ED for evaluation of persistent abdominal pain and sore throat.  Patient was initially seen in the emergency room on May 20.  Here was primarily here for abdominal pain at that time.  Patient had laboratory tests and CT scans.  His CT imaging was negative and ultimately had a test that was positive for mono.  Patient return to the emergency room yesterday because of persistent pain in his throat.  He is having diffuse body aches.  It is hard for him to eat or drink.  He spit up some blood recently.  Patient states he is still hurting.  He came tach to the ED because of the persistent symptoms  Home Medications Prior to Admission medications   Medication Sig Start Date End Date Taking? Authorizing Provider  ARIPiprazole (ABILIFY) 5 MG tablet Take 1 tablet (5 mg total) by mouth at bedtime. Patient taking differently: Take 5 mg by mouth in the morning. 07/15/22   Leata Mouse, MD  benzonatate (TESSALON) 100 MG capsule Take 1 capsule (100 mg total) by mouth every 8 (eight) hours. Patient not taking: Reported on 04/07/2023 09/26/22   Dione Booze, MD  hydrOXYzine (ATARAX) 25 MG tablet Take 1 tablet (25 mg total) by mouth at bedtime as needed and may repeat dose one time if needed for anxiety. Patient not taking: Reported on 04/07/2023 07/15/22   Leata Mouse, MD  ibuprofen (ADVIL) 400 MG tablet Take 400 mg by mouth in the morning and at bedtime.    [provider]      Allergies    Clindamycin/lincomycin    Review of Systems   Review of Systems  Physical Exam Updated Vital Signs BP 124/69   Pulse 92   Temp 99.4 F (37.4 C) (Oral)    Resp 20   SpO2 96%  Physical Exam Vitals and nursing note reviewed.  Constitutional:      General: He is not in acute distress.    Appearance: He is well-developed.  HENT:     Head: Normocephalic and atraumatic.     Right Ear: External ear normal.     Left Ear: External ear normal.     Mouth/Throat:     Pharynx: Pharyngeal swelling, oropharyngeal exudate and posterior oropharyngeal erythema present.  Eyes:     General: No scleral icterus.       Right eye: No discharge.        Left eye: No discharge.     Conjunctiva/sclera: Conjunctivae normal.  Neck:     Trachea: No tracheal deviation.  Cardiovascular:     Rate and Rhythm: Normal rate and regular rhythm.  Pulmonary:     Effort: Pulmonary effort is normal. No respiratory distress.     Breath sounds: Normal breath sounds. No stridor. No wheezing or rales.  Abdominal:     General: Bowel sounds are normal. There is no distension.     Palpations: Abdomen is soft.     Tenderness: There is abdominal tenderness in the left upper quadrant. There is no guarding or rebound.  Musculoskeletal:        General: No tenderness or deformity.  Cervical back: Neck supple.  Skin:    General: Skin is warm and dry.     Findings: No rash.  Neurological:     General: No focal deficit present.     Mental Status: He is alert.     Cranial Nerves: No cranial nerve deficit, dysarthria or facial asymmetry.     Sensory: No sensory deficit.     Motor: No abnormal muscle tone or seizure activity.     Coordination: Coordination normal.  Psychiatric:        Mood and Affect: Mood normal.     ED Results / Procedures / Treatments   Labs (all labs ordered are listed, but only abnormal results are displayed) Labs Reviewed  GROUP A STREP BY PCR - Abnormal; Notable for the following components:      Result Value   Group A Strep by PCR DETECTED (*)    All other components within normal limits  CBC - Abnormal; Notable for the following components:    HCT 38.5 (*)    All other components within normal limits  COMPREHENSIVE METABOLIC PANEL - Abnormal; Notable for the following components:   Calcium 8.6 (*)    AST 118 (*)    ALT 262 (*)    Alkaline Phosphatase 216 (*)    All other components within normal limits    EKG None  Radiology US Abdomen Complete  Result Date: 04/15/2023 CLINICAL DATA:  Mononucleosis Increasing abdominal pain for 1.5 weeks EXAM: ABDOMEN ULTRASOUND COMPLETE COMPARISON:  CT abdomen pelvis 04/07/2023 FINDINGS: Gallbladder: No gallstones or wall thickening visualized. No sonographic Murphy sign noted by sonographer. Common bile duct: Diameter: 3 mm Liver: No focal lesion identified. Within normal limits in parenchymal echogenicity. Portal vein is patent on color Doppler imaging with normal direction of blood flow towards the liver. IVC: No abnormality visualized. Pancreas: Visualized portion unremarkable. Spleen: Spleen is enlarged measuring up to 19.2 cm with a volume of 1225 mL. Right Kidney: Length: 11.1 cm. Echogenicity within normal limits. No mass or hydronephrosis visualized. Left Kidney: Length: 12.4 cm. Echogenicity within normal limits. No mass or hydronephrosis visualized. Abdominal aorta: No aneurysm visualized. Other findings: None. IMPRESSION: 1. No acute abnormality of the abdomen. 2. Splenomegaly, which appears similar to CT from 04/07/2023. Electronically Signed   By: Acquanetta Belling M.D.   On: 04/15/2023 13:23    Procedures Procedures    Medications Ordered in ED Medications  sodium chloride 0.9 % bolus 1,000 mL (1,000 mLs Intravenous New Bag/Given 04/15/23 1202)    Followed by  0.9 %  sodium chloride infusion (has no administration in time range)  penicillin g benzathine (BICILLIN LA) 1200000 UNIT/2ML injection 1.2 Million Units (has no administration in time range)  ketorolac (TORADOL) 30 MG/ML injection 30 mg (30 mg Intravenous Given 04/15/23 1207)  dexamethasone (DECADRON) injection 10 mg (10 mg  Intravenous Given 04/15/23 1207)    ED Course/ Medical Decision Making/ A&P Clinical Course as of 04/15/23 1351  Tue Apr 15, 2023  1326 Labs reviewed.  CBC normal.  Metabolic panel does showElevated LFTs [JK]  1327 LFTs are decreasing compared to previous values.  CBC normal. [JK]  1327 Strep test is positive. [JK]  1327 CT does show splenomegaly but unchanged [JK]    Clinical Course User Index [JK] Linwood Dibbles, MD                             Medical Decision Making Problems Addressed:  Infectious mononucleosis, with other complication, infectious mononucleosis due to unspecified organism: acute illness or injury that poses a threat to life or bodily functions Strep throat: acute illness or injury that poses a threat to life or bodily functions  Amount and/or Complexity of Data Reviewed Labs: ordered. Decision-making details documented in ED Course. Radiology: ordered.  Risk Prescription drug management.   Patient presented to ED for evaluation of persistent sore throat.  Patient was previously diagnosed with mono.  He also has been having abdominal pain and was noted to have splenomegaly.  Ultrasound was repeated to rule out any splenic rupture or significant enlargement.  Ultrasound did not show any acute changes compared to recent imaging.  He does have a component splenomegaly related to his mono.  Patient's LFTs are slightly improving.  Patient strep test was positive.  Appears to unfortunately have both strep and mono.  He was given a dose of penicillin IM.  Patient was treated with IV fluids and steroids with improvement of symptoms.        Final Clinical Impression(s) / ED Diagnoses Final diagnoses:  Strep throat  Infectious mononucleosis, with other complication, infectious mononucleosis due to unspecified organism    Rx / DC Orders ED Discharge Orders     None         Linwood Dibbles, MD 04/15/23 1351

## 2023-04-15 NOTE — ED Notes (Signed)
Pt given discharge instructions. Opportunities given for questions. Pt verbalizes understanding. PIV removed x1. Aishia Barkey R, RN 

## 2023-04-15 NOTE — Discharge Instructions (Addendum)
Your strep test was positive today.  You were given a dose of penicillin in the ED.  Sore throat is a combination of the mono as well as your strep throat.  Try to stay well-hydrated.  Follow-up with a primary care doctor or an ENT doctor to be rechecked

## 2023-04-15 NOTE — ED Triage Notes (Signed)
Pt arrives to ED with c/o severe sore throat x4 days. Was dx with Mono x1.5 weeks ago. Symptoms associated with abdominal pain and dysphagia.

## 2023-04-17 ENCOUNTER — Encounter (HOSPITAL_BASED_OUTPATIENT_CLINIC_OR_DEPARTMENT_OTHER): Payer: Self-pay

## 2023-04-17 ENCOUNTER — Emergency Department (HOSPITAL_BASED_OUTPATIENT_CLINIC_OR_DEPARTMENT_OTHER)
Admission: EM | Admit: 2023-04-17 | Discharge: 2023-04-17 | Disposition: A | Discharge status: Home / Self Care | Payer: BC Managed Care – PPO | Attending: Emergency Medicine | Admitting: Emergency Medicine

## 2023-04-17 ENCOUNTER — Other Ambulatory Visit: Payer: Self-pay

## 2023-04-17 ENCOUNTER — Other Ambulatory Visit (HOSPITAL_BASED_OUTPATIENT_CLINIC_OR_DEPARTMENT_OTHER): Payer: Self-pay

## 2023-04-17 DIAGNOSIS — R0981 Nasal congestion: Secondary | ICD-10-CM | POA: Insufficient documentation

## 2023-04-17 DIAGNOSIS — B2790 Infectious mononucleosis, unspecified without complication: Secondary | ICD-10-CM

## 2023-04-17 DIAGNOSIS — E860 Dehydration: Secondary | ICD-10-CM | POA: Diagnosis not present

## 2023-04-17 DIAGNOSIS — R110 Nausea: Secondary | ICD-10-CM | POA: Diagnosis not present

## 2023-04-17 DIAGNOSIS — J029 Acute pharyngitis, unspecified: Secondary | ICD-10-CM | POA: Insufficient documentation

## 2023-04-17 DIAGNOSIS — E871 Hypo-osmolality and hyponatremia: Secondary | ICD-10-CM | POA: Diagnosis not present

## 2023-04-17 DIAGNOSIS — R634 Abnormal weight loss: Secondary | ICD-10-CM | POA: Insufficient documentation

## 2023-04-17 HISTORY — DX: Streptococcal pharyngitis: J02.0

## 2023-04-17 LAB — BASIC METABOLIC PANEL
Anion gap: 10 (ref 5–15)
BUN: 11 mg/dL (ref 6–20)
CO2: 24 mmol/L (ref 22–32)
Calcium: 9.1 mg/dL (ref 8.9–10.3)
Chloride: 100 mmol/L (ref 98–111)
Creatinine, Ser: 0.98 mg/dL (ref 0.61–1.24)
GFR, Estimated: >60 mL/min (ref >60)
Glucose, Bld: 92 mg/dL (ref 70–99)
Potassium: 4.1 mmol/L (ref 3.5–5.1)
Sodium: 134 mmol/L — ABNORMAL LOW (ref 135–145)

## 2023-04-17 LAB — CBC WITH DIFFERENTIAL/PLATELET
Abs Immature Granulocytes: 0 10*3/uL (ref 0.00–0.07)
Basophils Absolute: 0 10*3/uL (ref 0.0–0.1)
Basophils Relative: 0 %
Eosinophils Absolute: 0 10*3/uL (ref 0.0–0.5)
Eosinophils Relative: 0 %
HCT: 38 % — ABNORMAL LOW (ref 39.0–52.0)
Hemoglobin: 13.3 g/dL (ref 13.0–17.0)
Lymphocytes Relative: 40 %
Lymphs Abs: 4 10*3/uL (ref 0.7–4.0)
MCH: 30.2 pg (ref 26.0–34.0)
MCHC: 35 g/dL (ref 30.0–36.0)
MCV: 86.2 fL (ref 80.0–100.0)
Monocytes Absolute: 1.2 10*3/uL — ABNORMAL HIGH (ref 0.1–1.0)
Monocytes Relative: 12 %
Neutro Abs: 4.8 10*3/uL (ref 1.7–7.7)
Neutrophils Relative %: 48 %
Platelets: 247 10*3/uL (ref 150–400)
RBC: 4.41 MIL/uL (ref 4.22–5.81)
RDW: 12.7 % (ref 11.5–15.5)
WBC: 10 10*3/uL (ref 4.0–10.5)
nRBC: 0 % (ref 0.0–0.2)

## 2023-04-17 LAB — RAPID HIV SCREEN (HIV 1/2 AB+AG)
HIV 1/2 Antibodies: NONREACTIVE
HIV-1 P24 Antigen - HIV24: NONREACTIVE

## 2023-04-17 MED ORDER — SODIUM CHLORIDE 0.9 % IV SOLN
1.0000 g | Freq: Once | INTRAVENOUS | Status: AC
  Administered 2023-04-17: 1 g via INTRAVENOUS
  Filled 2023-04-17: qty 10

## 2023-04-17 MED ORDER — ACETAMINOPHEN 500 MG PO TABS
1000.0000 mg | ORAL_TABLET | Freq: Once | ORAL | Status: AC
  Administered 2023-04-17: 1000 mg via ORAL
  Filled 2023-04-17: qty 2

## 2023-04-17 MED ORDER — SODIUM CHLORIDE 0.9 % IV BOLUS
1000.0000 mL | Freq: Once | INTRAVENOUS | Status: AC
  Administered 2023-04-17: 1000 mL via INTRAVENOUS

## 2023-04-17 MED ORDER — MORPHINE SULFATE (PF) 4 MG/ML IV SOLN
4.0000 mg | Freq: Once | INTRAVENOUS | Status: AC
  Administered 2023-04-17: 4 mg via INTRAVENOUS
  Filled 2023-04-17: qty 1

## 2023-04-17 MED ORDER — DEXAMETHASONE SODIUM PHOSPHATE 10 MG/ML IJ SOLN
10.0000 mg | Freq: Once | INTRAMUSCULAR | Status: AC
  Administered 2023-04-17: 10 mg via INTRAVENOUS
  Filled 2023-04-17: qty 1

## 2023-04-17 MED ORDER — KETOROLAC TROMETHAMINE 15 MG/ML IJ SOLN
15.0000 mg | Freq: Once | INTRAMUSCULAR | Status: AC
  Administered 2023-04-17: 15 mg via INTRAVENOUS
  Filled 2023-04-17: qty 1

## 2023-04-17 NOTE — Discharge Instructions (Addendum)
The steroid dose you were given will last approximately 2 days. Remember no contact sports. Follow-up for recheck on Monday with physician or return to the ER. For breathing difficulty or new concerns return the emergency department. Continue Tylenol every 4 hours and Motrin every 6 hours needed for pain and fever.

## 2023-04-17 NOTE — ED Triage Notes (Signed)
"  Sick x 2 weeks, seen here on 5/20 and had mono, then on 5/27 and 5/28 was treated for strep throat and got shots. Told to take tylenol and motrin, but I can not swallow the pills because my throat is so painful and swollen that I spit the pills back up and I just keep getting more weak" per pt

## 2023-04-17 NOTE — ED Provider Notes (Signed)
[place] EMERGENCY DEPARTMENT AT [facility] Provider Note   CSN: [id number] Arrival date & time: 04/17/23  8295     History  Chief Complaint  Patient presents with   Sore Throat    Anthony [name] is a 17 y.o. male.  Patient presents with persistent sore throat, nausea, difficulty tolerating oral liquid intake due to pain.  Patient was diagnosed with mono on May 20 and given instructions to avoid contact sports and supportive care and then diagnosed with strep throat on May 28 given penicillin shot and additional supportive care.  Patient does not build to swallow pills due to pain and has not tolerated significant oral intake the entire week.  Patient says he is lost weight over the past 10 days.       Home Medications Prior to Admission medications   Medication Sig Start Date End Date Taking? Authorizing Provider  ARIPiprazole (ABILIFY) 5 MG tablet Take 1 tablet (5 mg total) by mouth at bedtime. Patient taking differently: Take 5 mg by mouth in the morning. 07/15/22   Leata Mouse, MD  benzonatate (TESSALON) 100 MG capsule Take 1 capsule (100 mg total) by mouth every 8 (eight) hours. Patient not taking: Reported on 04/07/2023 09/26/22   Dione Booze, MD  hydrOXYzine (ATARAX) 25 MG tablet Take 1 tablet (25 mg total) by mouth at bedtime as needed and may repeat dose one time if needed for anxiety. Patient not taking: Reported on 04/07/2023 07/15/22   Leata Mouse, MD  ibuprofen (ADVIL) 400 MG tablet Take 400 mg by mouth in the morning and at bedtime.    [provider]      Allergies    Clindamycin/lincomycin    Review of Systems   Review of Systems  Constitutional:  Positive for fatigue and unexpected weight change. Negative for chills and fever.  HENT:  Positive for congestion and sore throat.   Eyes:  Negative for visual disturbance.  Respiratory:  Negative for shortness of breath.   Cardiovascular:  Negative for chest pain.   Gastrointestinal:  Negative for abdominal pain and vomiting.  Genitourinary:  Negative for dysuria and flank pain.  Musculoskeletal:  Negative for back pain, neck pain and neck stiffness.  Skin:  Negative for rash.  Neurological:  Negative for light-headedness and headaches.    Physical Exam Updated Vital Signs BP 117/65   Pulse 81   Temp 100 F (37.8 C)   Resp 17   Ht 6\' 1"  (1.854 m)   Wt 97.5 kg   SpO2 97%   BMI 28.37 kg/m  Physical Exam Vitals and nursing note reviewed.  Constitutional:      General: He is not in acute distress.    Appearance: He is well-developed.  HENT:     Head: Normocephalic and atraumatic.     Nose: Congestion present.     Mouth/Throat:     Mouth: Mucous membranes are dry.     Tonsils: Tonsillar exudate present. No tonsillar abscesses. 3+ on the right. 3+ on the left.  Eyes:     General:        Right eye: No discharge.        Left eye: No discharge.     Conjunctiva/sclera: Conjunctivae normal.  Neck:     Trachea: No tracheal deviation.  Cardiovascular:     Rate and Rhythm: Normal rate and regular rhythm.  Pulmonary:     Effort: Pulmonary effort is normal.     Breath sounds: Normal breath sounds.  Abdominal:     General: There is no distension.     Palpations: Abdomen is soft.     Tenderness: There is no abdominal tenderness. There is no guarding.  Musculoskeletal:     Cervical back: Normal range of motion and neck supple. No rigidity.  Skin:    General: Skin is warm.     Capillary Refill: Capillary refill takes less than 2 seconds.     Findings: No rash.  Neurological:     General: No focal deficit present.     Mental Status: He is alert.     Cranial Nerves: No cranial nerve deficit.  Psychiatric:        Mood and Affect: Mood normal.     ED Results / Procedures / Treatments   Labs (all labs ordered are listed, but only abnormal results are displayed) Labs Reviewed  CBC WITH DIFFERENTIAL/PLATELET - Abnormal; Notable for the  following components:      Result Value   HCT 38.0 (*)    Monocytes Absolute 1.2 (*)    All other components within normal limits  BASIC METABOLIC PANEL - Abnormal; Notable for the following components:   Sodium 134 (*)    All other components within normal limits  RAPID HIV SCREEN (HIV 1/2 AB+AG)  GC/CHLAMYDIA PROBE AMP ([place]) NOT AT [facility]    EKG None  Radiology US Abdomen Complete  Result Date: 04/15/2023 CLINICAL DATA:  Mononucleosis Increasing abdominal pain for 1.5 weeks EXAM: ABDOMEN ULTRASOUND COMPLETE COMPARISON:  CT abdomen pelvis 04/07/2023 FINDINGS: Gallbladder: No gallstones or wall thickening visualized. No sonographic Murphy sign noted by sonographer. Common bile duct: Diameter: 3 mm Liver: No focal lesion identified. Within normal limits in parenchymal echogenicity. Portal vein is patent on color Doppler imaging with normal direction of blood flow towards the liver. IVC: No abnormality visualized. Pancreas: Visualized portion unremarkable. Spleen: Spleen is enlarged measuring up to 19.2 cm with a volume of 1225 mL. Right Kidney: Length: 11.1 cm. Echogenicity within normal limits. No mass or hydronephrosis visualized. Left Kidney: Length: 12.4 cm. Echogenicity within normal limits. No mass or hydronephrosis visualized. Abdominal aorta: No aneurysm visualized. Other findings: None. IMPRESSION: 1. No acute abnormality of the abdomen. 2. Splenomegaly, which appears similar to CT from 04/07/2023. Electronically Signed   By: Acquanetta Belling M.D.   On: 04/15/2023 13:23    Procedures Procedures    Medications Ordered in ED Medications  acetaminophen (TYLENOL) tablet 1,000 mg (has no administration in time range)  sodium chloride 0.9 % bolus 1,000 mL (has no administration in time range)  morphine (PF) 4 MG/ML injection 4 mg (has no administration in time range)  sodium chloride 0.9 % bolus 1,000 mL (0 mLs Intravenous Stopped 04/17/23 1057)  dexamethasone (DECADRON) injection 10  mg (10 mg Intravenous Given 04/17/23 1005)  ketorolac (TORADOL) 15 MG/ML injection 15 mg (15 mg Intravenous Given 04/17/23 1005)  cefTRIAXone (ROCEPHIN) 1 g in sodium chloride 0.9 % 100 mL IVPB (0 g Intravenous Stopped 04/17/23 1057)    ED Course/ Medical Decision Making/ A&P                             Medical Decision Making Amount and/or Complexity of Data Reviewed Labs: ordered.  Risk OTC drugs. Prescription drug management.   Patient with recent diagnosis of mono followed by strep presents with persistent sore throat and clinical concern for dehydration pain control.  IV ordered for IV fluid bolus,  general blood work to check electrolytes/kidney function/white blood cell count.  Decadron and Toradol ordered for swelling and pain.  Patient has a fever plan for Tylenol.   Symptoms likely from combination of recent mono and strep, no signs of peritonsillar abscess on exam or deep space infection.  Other differentials include gonococcal as patient sexually active, GC will be sent and Rocephin dose given.   Blood work independently reviewed mild hyponatremia 134, repeat IV fluid bolus given.  Other electrolytes unremarkable with normal white blood cell count normal hemoglobin.  HIV quick test reviewed negative.         Final Clinical Impression(s) / ED Diagnoses Final diagnoses:  Pharyngitis due to infectious mononucleosis  Dehydration  Weight loss    Rx / DC Orders ED Discharge Orders     None         Blane Ohara, MD 04/17/23 1212

## 2023-04-18 LAB — GC/CHLAMYDIA PROBE AMP (~~LOC~~) NOT AT ARMC
Chlamydia: NEGATIVE
Comment: NEGATIVE
Comment: NORMAL
Neisseria Gonorrhea: NEGATIVE

## 2023-07-26 ENCOUNTER — Inpatient Hospital Stay (HOSPITAL_COMMUNITY): Payer: BC Managed Care – PPO

## 2023-07-26 ENCOUNTER — Emergency Department (HOSPITAL_COMMUNITY): Payer: BC Managed Care – PPO

## 2023-07-26 ENCOUNTER — Encounter (HOSPITAL_COMMUNITY): Payer: Self-pay

## 2023-07-26 ENCOUNTER — Other Ambulatory Visit: Payer: Self-pay

## 2023-07-26 ENCOUNTER — Observation Stay (HOSPITAL_COMMUNITY)
Admission: EM | Admit: 2023-07-26 | Discharge: 2023-07-27 | Disposition: A | Discharge status: Home / Self Care | Payer: BC Managed Care – PPO | Attending: General Surgery | Admitting: General Surgery

## 2023-07-26 DIAGNOSIS — S301XXA Contusion of abdominal wall, initial encounter: Secondary | ICD-10-CM | POA: Diagnosis present

## 2023-07-26 DIAGNOSIS — S62234A Other nondisplaced fracture of base of first metacarpal bone, right hand, initial encounter for closed fracture: Secondary | ICD-10-CM | POA: Insufficient documentation

## 2023-07-26 DIAGNOSIS — R531 Weakness: Secondary | ICD-10-CM | POA: Diagnosis present

## 2023-07-26 DIAGNOSIS — M25571 Pain in right ankle and joints of right foot: Secondary | ICD-10-CM | POA: Diagnosis present

## 2023-07-26 DIAGNOSIS — S0001XA Abrasion of scalp, initial encounter: Secondary | ICD-10-CM | POA: Diagnosis present

## 2023-07-26 DIAGNOSIS — Z79899 Other long term (current) drug therapy: Secondary | ICD-10-CM

## 2023-07-26 DIAGNOSIS — Y939 Activity, unspecified: Secondary | ICD-10-CM | POA: Insufficient documentation

## 2023-07-26 DIAGNOSIS — Z818 Family history of other mental and behavioral disorders: Secondary | ICD-10-CM

## 2023-07-26 DIAGNOSIS — Y999 Unspecified external cause status: Secondary | ICD-10-CM | POA: Insufficient documentation

## 2023-07-26 DIAGNOSIS — Z87891 Personal history of nicotine dependence: Secondary | ICD-10-CM | POA: Diagnosis not present

## 2023-07-26 DIAGNOSIS — S62330A Displaced fracture of neck of second metacarpal bone, right hand, initial encounter for closed fracture: Secondary | ICD-10-CM | POA: Diagnosis present

## 2023-07-26 DIAGNOSIS — Z23 Encounter for immunization: Secondary | ICD-10-CM | POA: Diagnosis not present

## 2023-07-26 DIAGNOSIS — Z881 Allergy status to other antibiotic agents status: Secondary | ICD-10-CM | POA: Diagnosis not present

## 2023-07-26 DIAGNOSIS — Y929 Unspecified place or not applicable: Secondary | ICD-10-CM | POA: Diagnosis not present

## 2023-07-26 DIAGNOSIS — F909 Attention-deficit hyperactivity disorder, unspecified type: Secondary | ICD-10-CM | POA: Diagnosis present

## 2023-07-26 DIAGNOSIS — S7002XA Contusion of left hip, initial encounter: Secondary | ICD-10-CM | POA: Diagnosis not present

## 2023-07-26 DIAGNOSIS — Y92410 Unspecified street and highway as the place of occurrence of the external cause: Secondary | ICD-10-CM | POA: Diagnosis not present

## 2023-07-26 DIAGNOSIS — M25552 Pain in left hip: Secondary | ICD-10-CM | POA: Diagnosis present

## 2023-07-26 DIAGNOSIS — R200 Anesthesia of skin: Secondary | ICD-10-CM | POA: Diagnosis present

## 2023-07-26 DIAGNOSIS — R519 Headache, unspecified: Secondary | ICD-10-CM | POA: Diagnosis present

## 2023-07-26 DIAGNOSIS — S62309A Unspecified fracture of unspecified metacarpal bone, initial encounter for closed fracture: Secondary | ICD-10-CM | POA: Diagnosis present

## 2023-07-26 DIAGNOSIS — S8992XA Unspecified injury of left lower leg, initial encounter: Secondary | ICD-10-CM | POA: Diagnosis present

## 2023-07-26 DIAGNOSIS — T148XXA Other injury of unspecified body region, initial encounter: Secondary | ICD-10-CM

## 2023-07-26 LAB — I-STAT CHEM 8, ED
BUN: 10 mg/dL (ref 6–20)
Calcium, Ion: 1.19 mmol/L (ref 1.15–1.40)
Chloride: 106 mmol/L (ref 98–111)
Creatinine, Ser: 1.2 mg/dL (ref 0.61–1.24)
Glucose, Bld: 81 mg/dL (ref 70–99)
HCT: 43 % (ref 39.0–52.0)
Hemoglobin: 14.6 g/dL (ref 13.0–17.0)
Potassium: 3.6 mmol/L (ref 3.5–5.1)
Sodium: 141 mmol/L (ref 135–145)
TCO2: 21 mmol/L — ABNORMAL LOW (ref 22–32)

## 2023-07-26 LAB — URINALYSIS, ROUTINE W REFLEX MICROSCOPIC
Bilirubin Urine: NEGATIVE
Glucose, UA: NEGATIVE mg/dL
Hgb urine dipstick: NEGATIVE
Ketones, ur: NEGATIVE mg/dL
Leukocytes,Ua: NEGATIVE
Nitrite: NEGATIVE
Protein, ur: NEGATIVE mg/dL
Specific Gravity, Urine: 1.039 — ABNORMAL HIGH (ref 1.005–1.030)
pH: 8 (ref 5.0–8.0)

## 2023-07-26 LAB — BASIC METABOLIC PANEL
Anion gap: 11 (ref 5–15)
BUN: 11 mg/dL (ref 6–20)
CO2: 21 mmol/L — ABNORMAL LOW (ref 22–32)
Calcium: 8.7 mg/dL — ABNORMAL LOW (ref 8.9–10.3)
Chloride: 104 mmol/L (ref 98–111)
Creatinine, Ser: 1.1 mg/dL (ref 0.61–1.24)
GFR, Estimated: >60 mL/min (ref >60)
Glucose, Bld: 89 mg/dL (ref 70–99)
Potassium: 3.5 mmol/L (ref 3.5–5.1)
Sodium: 136 mmol/L (ref 135–145)

## 2023-07-26 LAB — CBC
HCT: 36.7 % — ABNORMAL LOW (ref 39.0–52.0)
HCT: 39.8 % (ref 39.0–52.0)
Hemoglobin: 12.8 g/dL — ABNORMAL LOW (ref 13.0–17.0)
Hemoglobin: 13.6 g/dL (ref 13.0–17.0)
MCH: 29.6 pg (ref 26.0–34.0)
MCH: 28.8 pg (ref 26.0–34.0)
MCHC: 34.9 g/dL (ref 30.0–36.0)
MCHC: 34.2 g/dL (ref 30.0–36.0)
MCV: 84.8 fL (ref 80.0–100.0)
MCV: 84.3 fL (ref 80.0–100.0)
Platelets: 207 10*3/uL (ref 150–400)
Platelets: 219 10*3/uL (ref 150–400)
RBC: 4.33 MIL/uL (ref 4.22–5.81)
RBC: 4.72 MIL/uL (ref 4.22–5.81)
RDW: 12.2 % (ref 11.5–15.5)
RDW: 12.3 % (ref 11.5–15.5)
WBC: 7.5 10*3/uL (ref 4.0–10.5)
WBC: 12 10*3/uL — ABNORMAL HIGH (ref 4.0–10.5)
nRBC: 0 % (ref 0.0–0.2)
nRBC: 0 % (ref 0.0–0.2)

## 2023-07-26 LAB — COMPREHENSIVE METABOLIC PANEL WITH GFR
ALT: 28 U/L (ref 0–44)
AST: 27 U/L (ref 15–41)
Albumin: 4.4 g/dL (ref 3.5–5.0)
Alkaline Phosphatase: 84 U/L (ref 38–126)
Anion gap: 17 — ABNORMAL HIGH (ref 5–15)
BUN: 10 mg/dL (ref 6–20)
CO2: 20 mmol/L — ABNORMAL LOW (ref 22–32)
Calcium: 9.5 mg/dL (ref 8.9–10.3)
Chloride: 103 mmol/L (ref 98–111)
Creatinine, Ser: 1.23 mg/dL (ref 0.61–1.24)
GFR, Estimated: >60 mL/min
Glucose, Bld: 80 mg/dL (ref 70–99)
Potassium: 3.8 mmol/L (ref 3.5–5.1)
Sodium: 140 mmol/L (ref 135–145)
Total Bilirubin: 0.7 mg/dL (ref 0.3–1.2)
Total Protein: 7 g/dL (ref 6.5–8.1)

## 2023-07-26 LAB — ETHANOL: Alcohol, Ethyl (B): <10 mg/dL (ref <10)

## 2023-07-26 LAB — I-STAT CG4 LACTIC ACID, ED: Lactic Acid, Venous: 2.2 mmol/L (ref 0.5–1.9)

## 2023-07-26 LAB — SAMPLE TO BLOOD BANK

## 2023-07-26 LAB — PROTIME-INR
INR: 1 (ref 0.8–1.2)
Prothrombin Time: 13.5 s (ref 11.4–15.2)

## 2023-07-26 MED ORDER — METHOCARBAMOL 500 MG PO TABS
500.0000 mg | ORAL_TABLET | Freq: Three times a day (TID) | ORAL | Status: DC
  Administered 2023-07-26 – 2023-07-27 (×4): 500 mg via ORAL
  Filled 2023-07-26 (×4): qty 1

## 2023-07-26 MED ORDER — ONDANSETRON HCL 4 MG/2ML IJ SOLN: 4.0000 mg | Freq: Once | INTRAMUSCULAR | Status: AC

## 2023-07-26 MED ORDER — MORPHINE SULFATE (PF) 4 MG/ML IV SOLN
4.0000 mg | INTRAVENOUS | Status: DC | PRN
  Administered 2023-07-26 – 2023-07-27 (×7): 4 mg via INTRAVENOUS
  Filled 2023-07-26 (×7): qty 1

## 2023-07-26 MED ORDER — METHOCARBAMOL 1000 MG/10ML IJ SOLN
500.0000 mg | Freq: Three times a day (TID) | INTRAVENOUS | Status: DC
  Filled 2023-07-26 (×2): qty 5

## 2023-07-26 MED ORDER — ONDANSETRON HCL 4 MG/2ML IJ SOLN
4.0000 mg | Freq: Four times a day (QID) | INTRAMUSCULAR | Status: DC | PRN
  Administered 2023-07-26 (×2): 4 mg via INTRAVENOUS
  Filled 2023-07-26 (×2): qty 2

## 2023-07-26 MED ORDER — HYDRALAZINE HCL 20 MG/ML IJ SOLN: 10.0000 mg | INTRAMUSCULAR | Status: DC | PRN

## 2023-07-26 MED ORDER — OXYCODONE HCL 5 MG PO TABS
5.0000 mg | ORAL_TABLET | ORAL | Status: DC | PRN
  Administered 2023-07-26 – 2023-07-27 (×3): 5 mg via ORAL
  Filled 2023-07-26 (×3): qty 1

## 2023-07-26 MED ORDER — ACETAMINOPHEN 500 MG PO TABS
1000.0000 mg | ORAL_TABLET | Freq: Four times a day (QID) | ORAL | Status: DC
  Administered 2023-07-26 – 2023-07-27 (×5): 1000 mg via ORAL
  Filled 2023-07-26 (×5): qty 2

## 2023-07-26 MED ORDER — MORPHINE SULFATE (PF) 4 MG/ML IV SOLN: 4.0000 mg | Freq: Once | INTRAVENOUS | Status: AC

## 2023-07-26 MED ORDER — ONDANSETRON 4 MG PO TBDP: 4.0000 mg | ORAL_TABLET | Freq: Four times a day (QID) | ORAL | Status: DC | PRN

## 2023-07-26 MED ORDER — TETANUS-DIPHTH-ACELL PERTUSSIS 5-2.5-18.5 LF-MCG/0.5 IM SUSY
0.5000 mL | PREFILLED_SYRINGE | Freq: Once | INTRAMUSCULAR | Status: AC
  Administered 2023-07-26: 0.5 mL via INTRAMUSCULAR
  Filled 2023-07-26: qty 0.5

## 2023-07-26 MED ORDER — OXYCODONE HCL 5 MG PO TABS
10.0000 mg | ORAL_TABLET | ORAL | Status: DC | PRN
  Administered 2023-07-26: 10 mg via ORAL
  Filled 2023-07-26 (×2): qty 2

## 2023-07-26 MED ORDER — MORPHINE SULFATE (PF) 2 MG/ML IV SOLN
INTRAVENOUS | Status: AC
  Filled 2023-07-26: qty 2

## 2023-07-26 MED ORDER — ENOXAPARIN SODIUM 40 MG/0.4ML IJ SOSY
40.0000 mg | PREFILLED_SYRINGE | INTRAMUSCULAR | Status: DC
  Administered 2023-07-26: 40 mg via SUBCUTANEOUS
  Filled 2023-07-26: qty 0.4

## 2023-07-26 MED ORDER — LACTATED RINGERS IV SOLN: INTRAVENOUS | Status: DC

## 2023-07-26 MED ORDER — BACITRACIN ZINC 500 UNIT/GM EX OINT
TOPICAL_OINTMENT | Freq: Two times a day (BID) | CUTANEOUS | Status: DC
  Administered 2023-07-26: 31.5 via TOPICAL
  Administered 2023-07-26: 2 via TOPICAL
  Administered 2023-07-26 – 2023-07-27 (×2): 31.5 via TOPICAL
  Filled 2023-07-26: qty 1.8
  Filled 2023-07-26: qty 28.35

## 2023-07-26 MED ORDER — MORPHINE SULFATE (PF) 4 MG/ML IV SOLN
INTRAVENOUS | Status: AC
  Administered 2023-07-26: 4 mg via INTRAVENOUS
  Filled 2023-07-26: qty 1

## 2023-07-26 MED ORDER — IOHEXOL 350 MG/ML SOLN
75.0000 mL | Freq: Once | INTRAVENOUS | Status: AC | PRN
  Administered 2023-07-26: 75 mL via INTRAVENOUS

## 2023-07-26 MED ORDER — ONDANSETRON HCL 4 MG/2ML IJ SOLN
INTRAMUSCULAR | Status: AC
  Administered 2023-07-26: 4 mg via INTRAVENOUS
  Filled 2023-07-26: qty 2
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
## 2023-07-26 NOTE — ED Notes (Signed)
Patient transported to CT 

## 2023-07-26 NOTE — TOC CAGE-AID Note (Signed)
Transition of Care [facility]) - CAGE-AID Screening   Patient Details  Name: Anthony [name] MRN: [id number] Date of Birth: [birth year]  Transition of Care [facility]) CM/SW Contact:    Janora Norlander, RN Phone Number: [phone] 07/26/2023, 6:29 PM   Clinical Narrative: Pt here after having an [facility].  Pt reports that he quit smoking about a year ago.  Pt reports current alcohol use and does not do drugs.  Pt does not wish to have resources.  Screening complete.   CAGE-AID Screening:    Have You Ever Felt You Ought to Cut Down on Your Drinking or Drug Use?: No Have People Annoyed You By Critizing Your Drinking Or Drug Use?: No Have You Felt Bad Or Guilty About Your Drinking Or Drug Use?: No Have You Ever Had a Drink or Used Drugs First Thing In The Morning to Steady Your Nerves or to Get Rid of a Hangover?: No CAGE-AID Score: 0  Substance Abuse Education Offered: No

## 2023-07-26 NOTE — ED Notes (Signed)
Patient is awake and alert. Requesting pain medication and urinal. Will continue to monitor.
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
## 2023-07-26 NOTE — Plan of Care (Signed)

## 2023-07-26 NOTE — ED Notes (Signed)
Spoke to Facilities manager after speaking to [place] team provider to speak to the provider who assumed care of patient, paged [phone], awaiting return phone call.

## 2023-07-26 NOTE — Progress Notes (Signed)
Patient refused PRN Oxycodone due to previous history of substance abuse and overdose. Stated he would prefer to have the PRN morphine.

## 2023-07-26 NOTE — ED Notes (Signed)
Patient left the floor in stable condition, AOX4,with his belongings, staff and his family. Did not receive an additional phone call from trauma provider after the [id number] page.

## 2023-07-26 NOTE — Progress Notes (Signed)
Right index metacarpal neck fracture, apex dorsal angulation approx 40 degrees. Closed fracture. Patient is splinted. Elevate and perform finger ROM as tolerated.  F/u Monday morning outpatient at [facility], MD Orthopaedic Hand Surgeon [facility] Office number: [phone] [address], [address] [place], [place] [address]

## 2023-07-26 NOTE — ED Notes (Signed)
Patient transported to MRI 
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
## 2023-07-26 NOTE — ED Notes (Signed)
MD Luvenia Heller MD at bedside

## 2023-07-26 NOTE — ED Notes (Signed)
Patient returned to from CT at this time.

## 2023-07-26 NOTE — ED Notes (Signed)
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  methocarbamol (ROBAXIN) 500 mg in dextrose 5 % 50 mL IVPB ( Intravenous See Alternative 07/26/23 0309)  ondansetron (ZOFRAN-ODT) disintegrating tablet 4 mg ( Oral See Alternative 07/26/23 0833)    Or  ondansetron (ZOFRAN) injection 4 mg (4 mg Intravenous Given 07/26/23 0833)  hydrALAZINE (APRESOLINE) injection 10 mg (has no administration in time range)  lactated ringers infusion ( Intravenous New Bag/Given 07/26/23 0252)  oxyCODONE (Oxy IR/ROXICODONE) immediate release tablet 5 mg (has no administration in time range)  oxyCODONE (Oxy IR/ROXICODONE) immediate release tablet 10 mg (10 mg Oral Given 07/26/23 0639)  morphine (PF) 4 MG/ML injection 4 mg (4 mg Intravenous Given 07/26/23 0753)  bacitracin ointment (2 Applications Topical Given 07/26/23 0424)  Tdap (BOOSTRIX) injection 0.5 mL (0.5 mLs Intramuscular Given 07/26/23 0107)  morphine (PF) 4 MG/ML injection 4 mg (4 mg  Intravenous Given 07/26/23 0108)  ondansetron (ZOFRAN)  injection 4 mg (4 mg Intravenous Given 07/26/23 0108)  iohexol (OMNIPAQUE) 350 MG/ML injection 75 mL (75 mLs Intravenous Contrast Given 07/26/23 0125)    Mobility Walked at baseline since trauma unsure of ambulatory clearance.     Focused Assessments Trauma   R Recommendations: See Admitting Provider Note  Report given to:   Additional Notes:  Pt is alert and oriented. He is able to verbalize needs. Family at bedside.Still in C collar, paged 2 physicians and spoke with nurse manager to help answer questions for pt.
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
## 2023-07-27 MED ORDER — TRAMADOL HCL 50 MG PO TABS
50.0000 mg | ORAL_TABLET | Freq: Four times a day (QID) | ORAL | 0 refills | Status: AC | PRN
Start: 2023-07-27 — End: ?

## 2023-07-27 NOTE — Progress Notes (Signed)
Discharge instructions reviewed with patient. Aware of follow up appointments. Declining walker at discharge. Reports he and his father feel he does not need it.
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
## 2023-07-27 NOTE — Plan of Care (Signed)

## 2023-07-27 NOTE — TOC Transition Note (Signed)
Transition of Care [facility]) - CM/SW Discharge Note   Patient Details  Name: Anthony [name] MRN: [id number] Date of Birth: 19-Apr-2006  Transition of Care [facility]) CM/SW Contact:  Ronny Bacon, RN Phone Number: 07/27/2023, 9:56 AM   Clinical Narrative:  Patient is being discharge. RW with right platform ordered from [employer].     Final next level of care: Home/Self Care     Patient Goals and CMS Choice      Discharge Placement                         Discharge Plan and Services Additional resources added to the After Visit Summary for                  DME Arranged: Walker rolling (With right platform) DME Agency: [employer] Date DME Agency Contacted: 07/27/23 Time DME Agency Contacted: [phone] Representative spoke with at DME Agency: [facility]            Social Determinants of Health (SDOH) Interventions SDOH Screenings   Food Insecurity: No Food Insecurity (07/26/2023)  Housing: Low Risk  (07/26/2023)  Transportation Needs: No Transportation Needs (07/26/2023)  Utilities: Not At Risk (07/26/2023)  Alcohol Screen: Low Risk  (07/09/2022)  Depression (PHQ2-9): Low Risk  (07/16/2022)  Financial Resource Strain: Low Risk  (07/21/2019)  Physical Activity: Sufficiently Active (07/21/2019)  Social Connections: Unknown (02/05/2020)   Received from [facility]  Stress: Stress Concern Present (07/21/2019)  Tobacco Use: Medium Risk (07/26/2023)     Readmission Risk Interventions    07/08/2022    9:17 AM  Readmission Risk Prevention Plan  Post Dischage Appt Complete  Medication Screening Complete  Transportation Screening Complete

## 2023-07-27 NOTE — Plan of Care (Signed)
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
## 2024-03-12 ENCOUNTER — Emergency Department (HOSPITAL_BASED_OUTPATIENT_CLINIC_OR_DEPARTMENT_OTHER)

## 2024-03-12 ENCOUNTER — Encounter (HOSPITAL_BASED_OUTPATIENT_CLINIC_OR_DEPARTMENT_OTHER): Payer: Self-pay | Admitting: Emergency Medicine

## 2024-03-12 ENCOUNTER — Other Ambulatory Visit: Payer: Self-pay

## 2024-03-12 ENCOUNTER — Emergency Department (HOSPITAL_BASED_OUTPATIENT_CLINIC_OR_DEPARTMENT_OTHER)
Admission: EM | Admit: 2024-03-12 | Discharge: 2024-03-12 | Disposition: A | Discharge status: Home / Self Care | Attending: Emergency Medicine | Admitting: Emergency Medicine

## 2024-03-12 DIAGNOSIS — D72829 Elevated white blood cell count, unspecified: Secondary | ICD-10-CM | POA: Insufficient documentation

## 2024-03-12 DIAGNOSIS — R109 Unspecified abdominal pain: Secondary | ICD-10-CM | POA: Diagnosis present

## 2024-03-12 DIAGNOSIS — K2901 Acute gastritis with bleeding: Secondary | ICD-10-CM | POA: Insufficient documentation

## 2024-03-12 DIAGNOSIS — K2900 Acute gastritis without bleeding: Secondary | ICD-10-CM

## 2024-03-12 LAB — CBC
HCT: 47.4 % (ref 39.0–52.0)
Hemoglobin: 16.6 g/dL (ref 13.0–17.0)
MCH: 30.9 pg (ref 26.0–34.0)
MCHC: 35 g/dL (ref 30.0–36.0)
MCV: 88.1 fL (ref 80.0–100.0)
Platelets: 221 10*3/uL (ref 150–400)
RBC: 5.38 MIL/uL (ref 4.22–5.81)
RDW: 12.3 % (ref 11.5–15.5)
WBC: 14.1 10*3/uL — ABNORMAL HIGH (ref 4.0–10.5)
nRBC: 0 % (ref 0.0–0.2)

## 2024-03-12 LAB — RESP PANEL BY RT-PCR (RSV, FLU A&B, COVID)  RVPGX2
Influenza A by PCR: NEGATIVE
Influenza B by PCR: NEGATIVE
Resp Syncytial Virus by PCR: NEGATIVE
SARS Coronavirus 2 by RT PCR: NEGATIVE

## 2024-03-12 LAB — COMPREHENSIVE METABOLIC PANEL WITH GFR
ALT: 36 U/L (ref 0–44)
AST: 25 U/L (ref 15–41)
Albumin: 4.9 g/dL (ref 3.5–5.0)
Alkaline Phosphatase: 92 U/L (ref 38–126)
Anion gap: 13 (ref 5–15)
BUN: 13 mg/dL (ref 6–20)
CO2: 23 mmol/L (ref 22–32)
Calcium: 10 mg/dL (ref 8.9–10.3)
Chloride: 102 mmol/L (ref 98–111)
Creatinine, Ser: 1.14 mg/dL (ref 0.61–1.24)
GFR, Estimated: >60 mL/min (ref >60)
Glucose, Bld: 118 mg/dL — ABNORMAL HIGH (ref 70–99)
Potassium: 4.4 mmol/L (ref 3.5–5.1)
Sodium: 137 mmol/L (ref 135–145)
Total Bilirubin: 0.8 mg/dL (ref 0.0–1.2)
Total Protein: 6.9 g/dL (ref 6.5–8.1)

## 2024-03-12 LAB — URINALYSIS, ROUTINE W REFLEX MICROSCOPIC
Bilirubin Urine: NEGATIVE
Glucose, UA: NEGATIVE mg/dL
Hgb urine dipstick: NEGATIVE
Ketones, ur: NEGATIVE mg/dL
Leukocytes,Ua: NEGATIVE
Nitrite: NEGATIVE
Protein, ur: NEGATIVE mg/dL
Specific Gravity, Urine: >1.046 — ABNORMAL HIGH (ref 1.005–1.030)
pH: 8 (ref 5.0–8.0)

## 2024-03-12 LAB — LIPASE, BLOOD: Lipase: 18 U/L (ref 11–51)

## 2024-03-12 MED ORDER — METOCLOPRAMIDE HCL 5 MG/ML IJ SOLN
5.0000 mg | Freq: Once | INTRAMUSCULAR | Status: AC
  Administered 2024-03-12: 5 mg via INTRAVENOUS
  Filled 2024-03-12: qty 2

## 2024-03-12 MED ORDER — IOHEXOL 300 MG/ML  SOLN
100.0000 mL | Freq: Once | INTRAMUSCULAR | Status: AC | PRN
  Administered 2024-03-12: 100 mL via INTRAVENOUS

## 2024-03-12 MED ORDER — ONDANSETRON 4 MG PO TBDP
4.0000 mg | ORAL_TABLET | Freq: Three times a day (TID) | ORAL | 0 refills | Status: DC | PRN
  Filled 2024-03-12: qty 20, 7d supply, fill #0

## 2024-03-12 MED ORDER — ONDANSETRON 4 MG PO TBDP: 4.0000 mg | ORAL_TABLET | Freq: Three times a day (TID) | ORAL | 0 refills | Status: AC | PRN

## 2024-03-12 MED ORDER — SODIUM CHLORIDE 0.9 % IV BOLUS
1000.0000 mL | Freq: Once | INTRAVENOUS | Status: AC
Start: 2024-03-12 — End: 2024-03-12
  Administered 2024-03-12: 1000 mL via INTRAVENOUS

## 2024-03-12 MED ORDER — PANTOPRAZOLE SODIUM 40 MG PO TBEC: 40.0000 mg | DELAYED_RELEASE_TABLET | Freq: Every day | ORAL | 1 refills | Status: AC

## 2024-03-12 MED ORDER — ONDANSETRON HCL 4 MG/2ML IJ SOLN
4.0000 mg | Freq: Once | INTRAMUSCULAR | Status: AC | PRN
  Administered 2024-03-12: 4 mg via INTRAVENOUS
  Filled 2024-03-12: qty 2

## 2024-03-12 MED ORDER — SODIUM CHLORIDE 0.9 % IV BOLUS
1000.0000 mL | Freq: Once | INTRAVENOUS | Status: AC
  Administered 2024-03-12: 1000 mL via INTRAVENOUS

## 2024-03-12 NOTE — ED Notes (Signed)
Pt not in room, pt in CT.  

## 2024-03-12 NOTE — ED Notes (Signed)
ED PA at BS 

## 2024-03-12 NOTE — ED Triage Notes (Signed)
 Left [facility] yesterday. Abd pain this AM followed by emesis. Reports some dark and bright blood in his emesis over past 4 hrs. Lower abd pain bilateral.

## 2024-03-12 NOTE — ED Provider Notes (Signed)
**Anthony [name] De-Identified via Obfuscation**  Anthony Anthony [name]   CSN: [id number] Arrival date & time: 03/12/24  1326     History  No chief complaint on file.   Anthony Anthony [name] is a 18 y.o. male.  With past medical history of ADHD, depression, suicide attempt presented to emergency room with complaint of abdominal pain associated with emesis.  Patient reports that this started approximately 12 hours ago he reports approximately 30 episodes of vomiting.  He reports toward the end of vomiting it was dark brown in color.  He also reports that he had 3 episodes of vomiting that appeared to have small amount of bright red blood quantifies to about a tablespoon.  Denies any melena or blood in stool.  Has not had any repeat episodes of vomiting since Zofran  but does report he is still nauseous.  Denies marijuana use.  Reports that he was drinking alcohol at a party 3 days ago.  Denies fevers, chest pain or shortness of breath.  HPI     Home Medications Prior to Admission medications   Medication Sig Start Date End Date Taking? Authorizing Provider  traMADol  (ULTRAM ) 50 MG tablet Take 1 tablet (50 mg total) by mouth every 6 (six) hours as needed. 07/27/23   Enid Harry, MD      Allergies    Clindamycin/lincomycin    Review of Systems   Review of Systems  Physical Exam Updated Vital Signs BP 114/69   Pulse (!) 102   Temp 98.6 F (37 C) (Oral)   Resp 16   Wt 104.3 kg   SpO2 99%   BMI 30.34 kg/m  Physical Exam Vitals and nursing Anthony [name] reviewed.  Constitutional:      General: He is not in acute distress.    Appearance: He is not toxic-appearing.  HENT:     Head: Normocephalic and atraumatic.  Eyes:     General: No scleral icterus.    Conjunctiva/sclera: Conjunctivae normal.  Cardiovascular:     Rate and Rhythm: Normal rate and regular rhythm.     Pulses: Normal pulses.     Heart sounds: Normal heart sounds.  Pulmonary:     Effort: Pulmonary effort is normal.  No respiratory distress.     Breath sounds: Normal breath sounds.  Abdominal:     General: Abdomen is flat. Bowel sounds are normal. There is no distension.     Palpations: Abdomen is soft. There is no mass.     Tenderness: There is no abdominal tenderness.  Musculoskeletal:     Right lower leg: No edema.     Left lower leg: No edema.  Skin:    General: Skin is warm and dry.     Findings: No lesion.  Neurological:     General: No focal deficit present.     Mental Status: He is alert and oriented to person, place, and time. Mental status is at baseline.     ED Results / Procedures / Treatments   Labs (all labs ordered are listed, but only abnormal results are displayed) Labs Reviewed  COMPREHENSIVE METABOLIC PANEL WITH GFR - Abnormal; Notable for the following components:      Result Value   Glucose, Bld 118 (*)    All other components within normal limits  CBC - Abnormal; Notable for the following components:   WBC 14.1 (*)    All other components within normal limits  RESP PANEL BY RT-PCR (RSV, FLU A&B, COVID)  RVPGX2  LIPASE, BLOOD  URINALYSIS, ROUTINE W REFLEX MICROSCOPIC    EKG None  Radiology CT ABDOMEN PELVIS W CONTRAST Result Date: 03/12/2024 CLINICAL DATA:  Abdominal pain. EXAM: CT ABDOMEN AND PELVIS WITH CONTRAST TECHNIQUE: Multidetector CT imaging of the abdomen and pelvis was performed using the standard protocol following bolus administration of intravenous contrast. RADIATION DOSE REDUCTION: This exam was performed according to the departmental dose-optimization program which includes automated exposure control, adjustment of the mA and/or kV according to patient size and/or use of iterative reconstruction technique. CONTRAST:  OMNIPAQUE  IOHEXOL  300 MG/ML  SOLN COMPARISON:  CT dated 07/26/2023. FINDINGS: Lower chest: The visualized lung bases are clear. No intra-abdominal free air or free fluid. Hepatobiliary: No focal liver abnormality is seen. No  gallstones, gallbladder wall thickening, or biliary dilatation. Pancreas: Unremarkable. No pancreatic ductal dilatation or surrounding inflammatory changes. Spleen: Normal in size without focal abnormality. Adrenals/Urinary Tract: The adrenal glands unremarkable. Kidneys, visualized ureters, and urinary bladder appear unremarkable. Stomach/Bowel: Space there is no bowel obstruction or active inflammation. The appendix is normal. Vascular/Lymphatic: The abdominal aorta and IVC unremarkable. No portal venous gas. There is no adenopathy. Reproductive: The prostate and seminal vesicles are grossly unremarkable. No pelvic mass. Other: None Musculoskeletal: No acute or significant osseous findings. IMPRESSION: No acute intra-abdominal or pelvic pathology. Electronically Signed   By: Angus Bark M.D.   On: 03/12/2024 16:32    Procedures Procedures    Medications Ordered in ED Medications  metoCLOPramide  (REGLAN ) injection 5 mg (has no administration in time range)  sodium chloride  0.9 % bolus 1,000 mL (has no administration in time range)  ondansetron  (ZOFRAN ) injection 4 mg (4 mg Intravenous Given 03/12/24 1343)    ED Course/ Medical Decision Making/ A&P Clinical Course as of 03/12/24 1852  Fri Mar 12, 2024  1713 Feeling better, will PO challenge. [JB]  1824 Passed PO challenge, requesting discharge. [JB]    Clinical Course User Index [JB] Anthony Anthony [name], Anthony Organ, PA-C                                 Medical Decision Making Amount and/or Complexity of Data Reviewed Labs: ordered. Radiology: ordered.  Risk Prescription drug management.   Anthony Anthony [name] 18 y.o. presented today for abd pain. Working DDx includes, but not limited to, gastroenteritis, colitis, SBO, appendicitis, cholecystitis, hepatobiliary pathology, gastritis, PUD, ACS, dissection, pancreatitis, nephrolithiasis, AAA, UTI, pyelonephritis, torsion.   R/o DDx: These are considered less likely than current impression due to  history of present illness, physical exam, labs/imaging findings.  Review of prior external notes: none   Pmhx: Mood disorder   Unique Tests and My Interpretation:  CBC with mild leukocytosis of 14, hemoglobin is 16.6.  Lipase 18, CMP unremarkable.  Respiratory panel negative.  Imaging:  CT Abd/Pelvis with contrast: evaluate for structural/surgical etiology of patients' severe abdominal pain.    Problem List / ED Course / Critical interventions / Medication management  Reporting to emergency room with complaint of abdominal pain and nausea and vomiting.  His abdominal pain is nonfocal.  His abdomen is soft and nondistended.  He has not had fever and he is hemodynamically stable.  Reports he feels quite dehydrated due to excessive vomiting.  Will check basic labs, give Zofran , fluids and obtain CT scan to rule out any acute pathology.  Denies chest pain, shortness of breath. Has noticed very small amount of bright red blood in vomit, <1 tablespoon.  CBC with  mild leukocytosis which is most likely reactive.  Hemoglobin is 16.6 lipase is 18, CMP without any liver abnormality or kidney abnormality.  Respiratory panel is negative. UA without ketones. He has had no repeat episodes of vomiting throughout stay, he is tolerating oral intake, no sign of systemic illness, no elevated BUN or Creatine, normal imaging. No melena.  Will treat symptoms and get follow-up. I ordered medication including Reglan , NS Reevaluation of the patient after these medicines showed that the patient improved Patients vitals assessed. Upon arrival patient is  hemodynamically stable.  I have reviewed the patients home medicines and have made adjustments as needed   Plan:  F/u w/ PCP in 2-3d to ensure resolution of sx.  Patient was given return precautions. Patient stable for discharge at this time.  Patient educated on current sx/dx and verbalized understanding of plan. Return to ER w/ new or worsening sx.           Final Clinical Impression(s) / ED Diagnoses Final diagnoses:  Acute gastritis, presence of bleeding unspecified, unspecified gastritis type    Rx / DC Orders ED Discharge Orders     None         Anthony Anthony [name] 03/12/24 1939    Anthony Fredrickson, MD 03/13/24 1141

## 2024-03-12 NOTE — ED Notes (Signed)
EDPA into see, at [facility].

## 2024-03-12 NOTE — Discharge Instructions (Addendum)
 Zofran  as needed for nausea vomiting. Take protonix. Stay well-hydrated with water alternating Pedialyte and Gatorade drink.  Try bland foods.

## 2024-03-12 NOTE — ED Notes (Signed)
 Verbalizes "feeling light headed" walking to and from b/r. Steady gait.

## 2024-03-12 NOTE — ED Notes (Signed)
Up to b/r, steady gait 

## 2024-03-13 ENCOUNTER — Other Ambulatory Visit (HOSPITAL_BASED_OUTPATIENT_CLINIC_OR_DEPARTMENT_OTHER): Payer: Self-pay

## 2024-04-17 ENCOUNTER — Emergency Department (HOSPITAL_COMMUNITY)
Admission: EM | Admit: 2024-04-17 | Discharge: 2024-04-17 | Discharge status: Against Medical Advice | Attending: Emergency Medicine | Admitting: Emergency Medicine

## 2024-04-17 ENCOUNTER — Other Ambulatory Visit: Payer: Self-pay

## 2024-04-17 ENCOUNTER — Encounter (HOSPITAL_COMMUNITY): Payer: Self-pay | Admitting: *Deleted

## 2024-04-17 DIAGNOSIS — R202 Paresthesia of skin: Secondary | ICD-10-CM | POA: Diagnosis not present

## 2024-04-17 DIAGNOSIS — R42 Dizziness and giddiness: Secondary | ICD-10-CM | POA: Insufficient documentation

## 2024-04-17 DIAGNOSIS — R079 Chest pain, unspecified: Secondary | ICD-10-CM | POA: Insufficient documentation

## 2024-04-17 DIAGNOSIS — Z5321 Procedure and treatment not carried out due to patient leaving prior to being seen by health care provider: Secondary | ICD-10-CM | POA: Insufficient documentation

## 2024-04-17 DIAGNOSIS — W1830XA Fall on same level, unspecified, initial encounter: Secondary | ICD-10-CM | POA: Insufficient documentation

## 2024-04-17 NOTE — ED Triage Notes (Signed)
 Chest pain after  leaving a party he was hyperventilating and having numbness in both arms feeling dizzy and  fell into a wall in a br  he denies using drugs  initially his pain started  in his chest and lasted 15 minutes or so then the pain became less  he was cautioned  to relax  he no longer has chest pain it went away as he relaxed sl rapid pulse still  but lowering  below 100  numbness and tingling in his hands and arms gone also

## 2024-04-17 NOTE — ED Notes (Signed)
 The pt is now symptom free no chest pain not hyperventilating alert skin dry numbness and tingling gone  he started a new adhd med approx one week ago  he thinks that may have caused the anxiety   he he just wants to leave  he does not wish to be seen

## 2024-11-16 ENCOUNTER — Emergency Department (HOSPITAL_COMMUNITY)
Admission: EM | Admit: 2024-11-16 | Discharge: 2024-11-16 | Discharge status: Against Medical Advice | Source: Ambulatory Visit

## 2024-11-16 ENCOUNTER — Emergency Department (HOSPITAL_COMMUNITY)

## 2024-11-16 ENCOUNTER — Emergency Department (HOSPITAL_BASED_OUTPATIENT_CLINIC_OR_DEPARTMENT_OTHER): Admission: EM | Admit: 2024-11-16 | Discharge: 2024-11-16 | Disposition: A | Discharge status: Home / Self Care

## 2024-11-16 ENCOUNTER — Other Ambulatory Visit: Payer: Self-pay

## 2024-11-16 DIAGNOSIS — R110 Nausea: Secondary | ICD-10-CM | POA: Diagnosis not present

## 2024-11-16 DIAGNOSIS — N5089 Other specified disorders of the male genital organs: Secondary | ICD-10-CM | POA: Diagnosis not present

## 2024-11-16 DIAGNOSIS — N50819 Testicular pain, unspecified: Secondary | ICD-10-CM

## 2024-11-16 DIAGNOSIS — N50812 Left testicular pain: Secondary | ICD-10-CM | POA: Diagnosis present

## 2024-11-16 NOTE — ED Notes (Signed)
"  Called for pt, no answer  "

## 2024-11-16 NOTE — ED Triage Notes (Signed)
 Reports left sided testicle pain since 12/25. Reports swelling. Recent injury to area .

## 2024-11-16 NOTE — Discharge Instructions (Signed)
 Go to [facility] emergency department and check in.  Tell them you are there for a testicular ultrasound you were sent from [facility].

## 2024-11-16 NOTE — ED Triage Notes (Addendum)
 Quick triage note, pt to ED transfer from [facility] here for SCROTUM US 

## 2024-11-16 NOTE — ED Notes (Signed)
 Patient called for vitals. No response from lobby.

## 2024-11-16 NOTE — ED Notes (Signed)
 Pt observed walking out of ED with visitor, did not notify staff of leaving ED

## 2024-11-16 NOTE — ED Provider Notes (Signed)
 "  EMERGENCY DEPARTMENT AT [facility] Provider Note   CSN: [id number] Arrival date & time: 11/16/24  1732     Patient presents with: Testicle Pain   EDIBERTO SENS is a 18 y.o. male.   18 year old male presents for evaluation of testicle pain.  He was sent by urgent care for ultrasound to rule out torsion.  5 days ago he states a dog jumped on him.  He states that since then he has had pain but around 11 AM this morning had significantly worse pain than he has the last few days.  Does not know if his testicles are swollen.  Admits to some nausea.  Denies any other symptoms or concerns.   Testicle Pain Pertinent negatives include no chest pain, no abdominal pain and no shortness of breath.       Prior to Admission medications  Medication Sig Start Date End Date Taking? Authorizing Provider  ondansetron  (ZOFRAN -ODT) 4 MG disintegrating tablet Take 1 tablet (4 mg total) by mouth every 8 (eight) hours as needed for nausea or vomiting. 03/12/24   Barrett, Warren SAILOR, PA-C  ondansetron  (ZOFRAN -ODT) 4 MG disintegrating tablet Take 1 tablet (4 mg total) by mouth every 8 (eight) hours as needed for nausea or vomiting. 03/12/24   Barrett, Warren SAILOR, PA-C  pantoprazole  (PROTONIX ) 40 MG tablet Take 1 tablet (40 mg total) by mouth daily. 03/12/24   Barrett, Jamie N, PA-C  traMADol  (ULTRAM ) 50 MG tablet Take 1 tablet (50 mg total) by mouth every 6 (six) hours as needed. 07/27/23   Ebbie Cough, MD    Allergies: Clindamycin/lincomycin    Review of Systems  Constitutional:  Negative for chills and fever.  HENT:  Negative for ear pain and sore throat.   Eyes:  Negative for pain and visual disturbance.  Respiratory:  Negative for cough and shortness of breath.   Cardiovascular:  Negative for chest pain and palpitations.  Gastrointestinal:  Negative for abdominal pain and vomiting.  Genitourinary:  Positive for testicular pain. Negative for dysuria and hematuria.   Musculoskeletal:  Negative for arthralgias and back pain.  Skin:  Negative for color change and rash.  Neurological:  Negative for seizures and syncope.  All other systems reviewed and are negative.   Updated Vital Signs BP 135/76   Pulse 66   Temp 98 F (36.7 C) (Oral)   Resp 16   SpO2 100%   Physical Exam Vitals and nursing note reviewed.  Constitutional:      General: He is not in acute distress.    Appearance: Normal appearance. He is well-developed. He is not ill-appearing.  HENT:     Head: Normocephalic and atraumatic.  Eyes:     Conjunctiva/sclera: Conjunctivae normal.  Cardiovascular:     Rate and Rhythm: Normal rate and regular rhythm.     Heart sounds: No murmur heard. Pulmonary:     Effort: Pulmonary effort is normal. No respiratory distress.     Breath sounds: Normal breath sounds.  Abdominal:     Palpations: Abdomen is soft.     Tenderness: There is no abdominal tenderness.  Genitourinary:    Comments: Deferred as patient was in the hallway Musculoskeletal:        General: No swelling.     Cervical back: Neck supple.  Skin:    General: Skin is warm and dry.     Capillary Refill: Capillary refill takes less than 2 seconds.  Neurological:     Mental Status: He is  alert.  Psychiatric:        Mood and Affect: Mood normal.     (all labs ordered are listed, but only abnormal results are displayed) Labs Reviewed - No data to display  EKG: None  Radiology: No results found.   Procedures   Medications Ordered in the ED - No data to display                                  Medical Decision Making Patient here for ultrasound to rule out torsion of his testicles.  Exam was deferred as patient was in hallway, due to volumes there was no bed to examine testicles then.  Patient has had an injury for 5 days but significant worsening of his pain that started today.  Unfortunately ultrasound is not here any longer today.  Patient will be transferred to  [facility] ER by private vehicle for ultrasound of his testicles. Dr. Pamella accepting.  Patient is here with his dad who will drive him to the ER.  I did place a outpatient order for imaging to be done tomorrow morning if for some reason the patient changes his mind or is unable to get ultrasound tonight.  Problems Addressed: Pain in testicle, unspecified laterality: undiagnosed new problem with uncertain prognosis  Amount and/or Complexity of Data Reviewed External Data Reviewed: notes.    Details: Urgent care records reviewed from today and patient was advised to come to the ER to rule out testicular torsion Radiology: ordered and independent interpretation performed. Decision-making details documented in ED Course.    Details: Ordered and pending  Risk OTC drugs. Prescription drug management.    Final diagnoses:  Pain in testicle, unspecified laterality    ED Discharge Orders          Ordered    US  Scrotum        11/16/24 1848               Gennaro Duwaine CROME, DO 11/16/24 1905  "
